# Patient Record
Sex: Female | Born: 1935 | Hispanic: No | Marital: Single | Smoking: Never smoker
Health system: Southern US, Community
[De-identification: ages and names within clinical notes are randomized; demographics above are authoritative.]

## PROBLEM LIST (undated history)

## (undated) DIAGNOSIS — Z8719 Personal history of other diseases of the digestive system: Secondary | ICD-10-CM

## (undated) DIAGNOSIS — K5730 Diverticulosis of large intestine without perforation or abscess without bleeding: Secondary | ICD-10-CM

## (undated) DIAGNOSIS — M8580 Other specified disorders of bone density and structure, unspecified site: Secondary | ICD-10-CM

## (undated) DIAGNOSIS — E079 Disorder of thyroid, unspecified: Secondary | ICD-10-CM

## (undated) DIAGNOSIS — R569 Unspecified convulsions: Secondary | ICD-10-CM

## (undated) DIAGNOSIS — K649 Unspecified hemorrhoids: Secondary | ICD-10-CM

## (undated) DIAGNOSIS — M1990 Unspecified osteoarthritis, unspecified site: Secondary | ICD-10-CM

## (undated) DIAGNOSIS — H3530 Unspecified macular degeneration: Secondary | ICD-10-CM

## (undated) DIAGNOSIS — K219 Gastro-esophageal reflux disease without esophagitis: Secondary | ICD-10-CM

## (undated) HISTORY — PX: FOOT SURGERY: SHX648

## (undated) HISTORY — PX: JOINT REPLACEMENT: SHX530

## (undated) HISTORY — DX: Diverticulosis of large intestine without perforation or abscess without bleeding: K57.30

## (undated) HISTORY — DX: Unspecified hemorrhoids: K64.9

## (undated) HISTORY — PX: FEMUR FRACTURE SURGERY: SHX633

## (undated) HISTORY — PX: LASER ABLATION: SHX1947

## (undated) HISTORY — DX: Unspecified convulsions: R56.9

## (undated) HISTORY — PX: COLONOSCOPY: SHX174

## (undated) HISTORY — DX: Gastro-esophageal reflux disease without esophagitis: K21.9

## (undated) HISTORY — DX: Disorder of thyroid, unspecified: E07.9

## (undated) HISTORY — PX: CARPAL TUNNEL RELEASE: SHX101

## (undated) HISTORY — PX: BREAST SURGERY: SHX581

## (undated) HISTORY — PX: EYE SURGERY: SHX253

---

## 1997-11-11 ENCOUNTER — Ambulatory Visit (HOSPITAL_COMMUNITY): Admission: RE | Admit: 1997-11-11 | Discharge: 1997-11-11 | Payer: Self-pay | Admitting: Gastroenterology

## 1998-03-06 ENCOUNTER — Other Ambulatory Visit: Admission: RE | Admit: 1998-03-06 | Discharge: 1998-03-06 | Payer: Self-pay | Admitting: Endocrinology

## 2000-03-11 ENCOUNTER — Other Ambulatory Visit: Admission: RE | Admit: 2000-03-11 | Discharge: 2000-03-11 | Payer: Self-pay | Admitting: Endocrinology

## 2000-12-05 ENCOUNTER — Inpatient Hospital Stay (HOSPITAL_COMMUNITY): Admission: EM | Admit: 2000-12-05 | Discharge: 2000-12-16 | Payer: Self-pay | Admitting: Emergency Medicine

## 2000-12-05 ENCOUNTER — Encounter: Payer: Self-pay | Admitting: Orthopaedic Surgery

## 2000-12-05 ENCOUNTER — Encounter: Payer: Self-pay | Admitting: Emergency Medicine

## 2000-12-09 ENCOUNTER — Encounter: Payer: Self-pay | Admitting: Orthopaedic Surgery

## 2001-08-25 ENCOUNTER — Ambulatory Visit (HOSPITAL_COMMUNITY): Admission: RE | Admit: 2001-08-25 | Discharge: 2001-08-25 | Payer: Self-pay | Admitting: Gastroenterology

## 2001-08-25 ENCOUNTER — Encounter (INDEPENDENT_AMBULATORY_CARE_PROVIDER_SITE_OTHER): Payer: Self-pay | Admitting: Specialist

## 2008-11-21 ENCOUNTER — Inpatient Hospital Stay (HOSPITAL_COMMUNITY): Admission: RE | Admit: 2008-11-21 | Discharge: 2008-11-25 | Payer: Self-pay | Admitting: Orthopedic Surgery

## 2010-06-29 LAB — BASIC METABOLIC PANEL
BUN: 3 mg/dL — ABNORMAL LOW (ref 6–23)
CO2: 26 mEq/L (ref 19–32)
CO2: 28 mEq/L (ref 19–32)
Calcium: 8.5 mg/dL (ref 8.4–10.5)
Chloride: 103 mEq/L (ref 96–112)
Chloride: 109 mEq/L (ref 96–112)
Creatinine, Ser: 0.53 mg/dL (ref 0.4–1.2)
Creatinine, Ser: 0.58 mg/dL (ref 0.4–1.2)
GFR calc Af Amer: >60 mL/min (ref >60)
GFR calc Af Amer: >60 mL/min (ref >60)
GFR calc non Af Amer: >60 mL/min (ref >60)
Glucose, Bld: 138 mg/dL — ABNORMAL HIGH (ref 70–99)
Potassium: 3.5 mEq/L (ref 3.5–5.1)
Sodium: 132 mEq/L — ABNORMAL LOW (ref 135–145)

## 2010-06-29 LAB — PROTIME-INR
INR: 1.3 (ref 0.00–1.49)
INR: 1.5 (ref 0.00–1.49)
Prothrombin Time: 16.1 seconds — ABNORMAL HIGH (ref 11.6–15.2)
Prothrombin Time: 18.4 seconds — ABNORMAL HIGH (ref 11.6–15.2)

## 2010-06-29 LAB — CBC
HCT: 30.5 % — ABNORMAL LOW (ref 36.0–46.0)
HCT: 30.8 % — ABNORMAL LOW (ref 36.0–46.0)
Hemoglobin: 10.4 g/dL — ABNORMAL LOW (ref 12.0–15.0)
Hemoglobin: 10.5 g/dL — ABNORMAL LOW (ref 12.0–15.0)
MCHC: 34 g/dL (ref 30.0–36.0)
MCHC: 34 g/dL (ref 30.0–36.0)
MCV: 96.6 fL (ref 78.0–100.0)
MCV: 97.1 fL (ref 78.0–100.0)
Platelets: 137 10*3/uL — ABNORMAL LOW (ref 150–400)
Platelets: 138 10*3/uL — ABNORMAL LOW (ref 150–400)
RBC: 3.15 MIL/uL — ABNORMAL LOW (ref 3.87–5.11)
RBC: 3.19 MIL/uL — ABNORMAL LOW (ref 3.87–5.11)
RDW: 13.2 % (ref 11.5–15.5)
RDW: 13.8 % (ref 11.5–15.5)
WBC: 5.5 10*3/uL (ref 4.0–10.5)
WBC: 7.2 10*3/uL (ref 4.0–10.5)

## 2010-06-30 LAB — COMPREHENSIVE METABOLIC PANEL
CO2: 27 mEq/L (ref 19–32)
Calcium: 9.7 mg/dL (ref 8.4–10.5)
Creatinine, Ser: 0.66 mg/dL (ref 0.4–1.2)
GFR calc Af Amer: >60 mL/min (ref >60)
GFR calc non Af Amer: >60 mL/min (ref >60)
Glucose, Bld: 89 mg/dL (ref 70–99)

## 2010-06-30 LAB — PROTIME-INR: INR: 1.1 (ref 0.00–1.49)

## 2010-06-30 LAB — CBC
Hemoglobin: 11.1 g/dL — ABNORMAL LOW (ref 12.0–15.0)
Hemoglobin: 13.5 g/dL (ref 12.0–15.0)
MCHC: 33.5 g/dL (ref 30.0–36.0)
MCHC: 34.1 g/dL (ref 30.0–36.0)
MCV: 96.7 fL (ref 78.0–100.0)
MCV: 97.1 fL (ref 78.0–100.0)
RBC: 3.36 MIL/uL — ABNORMAL LOW (ref 3.87–5.11)
RBC: 4.15 MIL/uL (ref 3.87–5.11)
WBC: 6.2 10*3/uL (ref 4.0–10.5)

## 2010-06-30 LAB — TYPE AND SCREEN: ABO/RH(D): B POS

## 2010-06-30 LAB — URINALYSIS, ROUTINE W REFLEX MICROSCOPIC
Nitrite: NEGATIVE
Specific Gravity, Urine: 1.016 (ref 1.005–1.030)
pH: 6 (ref 5.0–8.0)

## 2010-06-30 LAB — BASIC METABOLIC PANEL
CO2: 26 mEq/L (ref 19–32)
Calcium: 8.4 mg/dL (ref 8.4–10.5)
Chloride: 105 mEq/L (ref 96–112)
GFR calc Af Amer: >60 mL/min (ref >60)
Sodium: 134 mEq/L — ABNORMAL LOW (ref 135–145)

## 2010-06-30 LAB — URINE MICROSCOPIC-ADD ON

## 2010-07-20 ENCOUNTER — Other Ambulatory Visit: Payer: Self-pay | Admitting: Surgery

## 2010-08-07 NOTE — Op Note (Signed)
[name], Walker               ACCOUNT NO.:  [id number]   MEDICAL RECORD NO.:  [id number]          PATIENT TYPE:  INP   LOCATION:  NA                           FACILITY:  [facility]   PHYSICIAN:  Ollen Gross, M.D.    DATE OF BIRTH:  08/12/1930   DATE OF PROCEDURE:  11/21/2008  DATE OF DISCHARGE:                               OPERATIVE REPORT   PREOPERATIVE DIAGNOSIS:  Osteoarthritis, left knee.   POSTOPERATIVE DIAGNOSIS:  Osteoarthritis, left knee.   PROCEDURE:  Left total knee arthroplasty.   SURGEON:  Ollen Gross, M.D.   ASSISTANT:  Avel Peace, PA-C   ANESTHESIA:  Spinal.   ESTIMATED BLOOD LOSS:  Minimal.   DRAINS:  None.   TOURNIQUET TIME:  24 minutes at 300 mmHg.   COMPLICATIONS:  None.   CONDITION:  Stable to recovery.   BRIEF CLINICAL NOTE:  [name] is a 77 year old female with advanced  arthritis of the left knee with progressively worsening pain and  dysfunction.  She has failed nonoperative management and presents now  for left total knee arthroplasty.   PROCEDURE IN DETAIL:  After successful administration of spinal  anesthetic, a tourniquet is placed on her left thigh and her left lower  extremity is prepped and draped in the usual sterile fashion.  Extremities were wrapped in Esmarch, knee flexed, tourniquet inflated to  300 mmHg.  Midline incision was made with 10 blade through subcutaneous  tissue to the level of the extensor mechanism.  A fresh blade is used  make a medial parapatellar arthrotomy.  Soft tissue on the proximal  medial tibia is subperiosteally elevated to the joint line with the  knife and into the semimembranosus bursa with a Cobb elevator.  Soft  tissue laterally is elevated with attention being paid to avoid the  patellar tendon on the tibial tubercle.  Patella subluxed laterally,  knee flexed 90 degrees and ACL and PCL were removed.  Drill was used to  create a starting hole in the distal femur and the canal was thoroughly  irrigated.  The 5-degree left valgus alignment guide is placed and the  block is pinned to remove 10 mm of the distal femur.  Distal femoral  resection is made with an oscillating saw.  Sizing blocks placed, size  2.5 is most appropriate.  Rotation is marked off the epicondylar axis.  A size 2.5 cutting block is placed and then the anterior-posterior and  chamfer cuts are made.   The tibia subluxed forward and the menisci were removed.  Extramedullary  tibial alignment guide is placed referencing proximally at the medial  aspect of tibial tubercle and distally along the second metatarsal axis  and tibial crest.  The blocks were pinned to remove the 2 mm off the  more deficient medial side.  Tibial resection was made with an  oscillating saw.  A size 2.5 is the most appropriate tibial component  and the proximal tibia is prepared with a modular drill and keel punch  for the size 2.5.  Femoral preparation is completed with the  intercondylar cut.   Size 2.5  mobile bearing tibial trial 2.5 posterior stabilized femoral  trial and a 10-mm posterior stabilized rotating platform insert trial  were placed.  With the 10 there was some AP laxity so we went to 12.5  which allowed for full extension with excellent varus-valgus and  anterior-posterior balance throughout full range of motion.  The patella  was then everted and thickness measured to be 22 mm.  The patella was  cut to a thickness of 12 mm with the oscillating saw and a 35 template  is placed, lug holes were drilled, trial patella was placed and it  tracks normally.  Osteophytes were removed off the posterior femur with  the trial in place.  All trials were removed and the cut bone surfaces  are prepared with pulsatile lavage.  Cement is mixed and once ready for  implantation the size 2.5 mobile bearing tibial tray size 2.5 posterior  stabilized femur and 35 patella are cemented into place and patella is  held in place with a clamp.   Trial 12.5-mm insert was placed, knee held  in full extension and all extruded cement removed.  When the cement was  fully hardened, then a permanent 12.5 mm posterior stabilized rotating  platform insert is placed in the tibial tray.  It was copiously  irrigated with saline solution.  FloSeal was injected on the medial and  lateral gutters and suprapatellar area.  Moist sponge is placed and  tourniquet released for total time of 24 minutes.  Sponge is held for 2  minutes then removed.  Minimal bleeding was encountered.  The bleeding  that is encountered is stopped with electrocautery.  The wound is again  irrigated to remove the FloSeal and then the arthrotomy closed with  interrupted #1 PDS.  Flexion against gravity is about 140 degrees.  Subcu was closed with interrupted 2-0 Vicryl and subcuticular running 4-  0 Monocryl.  The incisions were cleaned and dried and Steri-Strips and a  bulky sterile dressing were applied.  She was then placed into a knee  immobilizer, awakened and transported to recovery in stable condition.      Ollen Gross, M.D.  Electronically Signed     FA/MEDQ  D:  11/21/2008  T:  11/21/2008  Job:  [id number]

## 2010-08-07 NOTE — Discharge Summary (Signed)
NAMEKYNDAHL, JABLON               ACCOUNT NO.:  [id number]   MEDICAL RECORD NO.:  [id number]          PATIENT TYPE:  INP   LOCATION:  1617                         FACILITY:  [facility]   PHYSICIAN:  Ollen Gross, M.D.    DATE OF BIRTH:  01-21-1931   DATE OF ADMISSION:  11/21/2008  DATE OF DISCHARGE:  11/24/2008                               DISCHARGE SUMMARY   ADMISSION DIAGNOSES:  1. Osteoarthritis of the left knee.  2. Impaired vision.  3. Cataracts.  4. History of phlebitis.  5. Varicose veins.  6. Hypothyroidism.  7. Osteopenia.   DISCHARGE DIAGNOSES:  1. Osteoarthritis of the left knee status post left total knee      replacement arthroplasty.  2. Postoperative hyponatremia.  3. Impaired vision.  4. Cataracts.  5. History of phlebitis.  6. Varicose veins.  7. Hypothyroidism.  8. Osteopenia.   PROCEDURE:  November 21, 2008, left total knee.   SURGEON:  Ollen Gross, M.D.   ASSISTANT:  Alexzandrew L. Perkins, P.A.C.   ANESTHESIA:  Spinal.   TOURNIQUET TIME:  Twenty-four minutes.   CONSULTS:  None.   BRIEF HISTORY:  Ms. Roma is a 77 year old female with end-stage arthritis  of the left knee with progressive worsening pain and dysfunction, who  failed operative management and now presents for left total knee  arthroplasty.   LABORATORY DATA:  Preop CBC showed hemoglobin 13.5, hematocrit 40.3,  white cell count 3.6, platelets 180, PT/INR 13.2 and 1.0, PTT of 28.  Chem panel on admission all within normal limits.  Preop UA did show  trace leukocytes, 0-2 white cells, rare squamous, otherwise negative.  Serial CBCs were followed.  Hemoglobin dropped down to 11.1 and then  10.4.  Last known H and H 10.5 and 30.8.  Serial BMETs were followed.  Sodium did drop from 142-134, last noted at 132.  Recommend rechecking  while at [facility] next week or end of week.   DIAGNOSTICS:  1. Chest x-ray October 06, 2008, two-view chest, no active disease.  2. EKG October 06, 2008,  sinus rhythm with first-degree AV block      confirmed by Dr. Evlyn Kanner.   HOSPITAL COURSE:  The patient admitted to [facility], taken to  the OR and underwent the above stated procedure without complication.  The patient tolerated the procedure well.  Later transferred to the  recovery room on the orthopedic floor and started on PCA and p.o.  analgesic pain control following surgery.  Given 24 hours postop IV  antibiotics.  Doing pretty well on the morning of day 1 with the  exception of nausea.  Discontinued the PCA.  She wanted look into a  skilled rehab facility, so we got social work involved.  Started back on  her home meds.  Hemoglobin looked good.  Sodium was down just a little  bit felt to be more to a dilutional component.  By day 2, the nausea  that was happening on day 1 had all but resolved.  She was feeling  better.  Sodium was down a little bit just from a delusional component,  but she was diuresing her fluids well.  Felt that it would concentrate  back up on its own.  Dressing was changed and incision looked good.  By  day 3, she was waiting on final bed offers and wanted a bed over at  [facility], anticipated a bed.  Was seen in rounds.  Hemoglobin was  stable.  No complaints.  Transferred at that time.   DISPOSITION:  Transferred to [facility] on November 24, 2008.   CURRENT TRANSFER MEDICATIONS:  1. She is on Coumadin, Coumadin protocol.  Please titrate the Coumadin      level for a target INR between 2.0 and 3.0.  She needs to be on      Coumadin for a total of 3 weeks.  2. Colace 100 mg p.o. b.i.d.  3. Levothyroxine 75 mcg p.o. daily.  4. Robaxin 500 mg p.o. q.6-8 h. p.r.n. spasm.  5. Tylenol 325 one-two every 4-6 hours as needed for mild pain.  6. Percocet 5 mg one-two every 4 hours as needed for moderate pain.  7. Senokot p.o. daily p.r.n. constipation.  8. Please note her Evista and vitamin E are currently on hold while      she is on Coumadin.    DIET:  As tolerated.   ACTIVITY:  She is weightbearing as tolerated total hip protocol.  She  may start showering, however, do not submerge the incision under water.  Gait training ambulation, ADLs, PT and OT for total knee protocol.   FOLLOW UP:  She needs to follow up with Dr. Lequita Halt in the office 2  weeks from date of surgery.  Please contact the office of [phone] to  help arrange follow up appointment.   CONDITION ON DISCHARGE:  Improving.      Alexzandrew L. Perkins, P.A.C.      Ollen Gross, M.D.  Electronically Signed    ALP/MEDQ  D:  11/24/2008  T:  11/24/2008  Job:  [id number]   cc:   Ollen Gross, M.D.  Fax: [phone]   Tera Mater. Evlyn Kanner, M.D.  Fax: [phone]

## 2010-08-07 NOTE — H&P (Signed)
[name], [name]               ACCOUNT NO.:  [id number]   MEDICAL RECORD NO.:  [id number]          PATIENT TYPE:  INP   LOCATION:  NA                           FACILITY:  [facility]   PHYSICIAN:  Ollen Gross, M.D.    DATE OF BIRTH:  [birth year]   DATE OF ADMISSION:  DATE OF DISCHARGE:                              HISTORY & PHYSICAL   CHIEF COMPLAINT:  Left knee pain.   HISTORY OF PRESENT ILLNESS:  The patient has been followed by Dr.  Lequita Walker for worsening left knee pain.  The patient states that her left  knee is hurting her constantly, both with activity and with rest, it is  preventing her from doing things.  The patient states she is also having  a hard time getting around.  The patient's x-ray reveals marked  arthritic change in the medial and patellofemoral compartments.  Dr.  Lequita Walker discussed injection as a short term solution, but the patient  states that she would like a more permanent option and feels that she is  ready for a left total knee arthroplasty.   ALLERGIES:  The patient is allergic to RED DYE.   CURRENT MEDICATIONS:  1. Vitamin D 50,000 units once a week.  2. Evista 60 mg 1 tablet daily.  3. Synthroid 0.75 mcg once daily.   Note, I discussed that patient needs to please stop the Vitamin D and  Evista and any multivitamin or aspirin 7 days prior to surgery.   CURRENT MEDICAL HISTORY:  1. Left knee arthritis.  2. Impaired vision.  3. Cataracts.  4. Phlebitis  5. Varicose veins.  6. Hypothyroidism.  7. Osteopenia.   PAST SURGICAL HISTORY:  1. Right knee replacement.  2. Cataract surgery.  3. Lens implants in her eyes bilaterally.   REVIEW OF SYSTEMS:  GENERAL:  No fevers, chills or night sweats.  HEENT/NEURO:  The patient denies any headaches, blurred vision.  Positive for balance problems.  RESPIRATORY:  Patient denies any  shortness of breath.  The patient admits that she does have seasonal  allergies.  The patient's last chest x-ray was October 06, 2008, and was  normal.  CARDIOVASCULAR:  Negative for chest pain, negative for  palpitations.  The patient's last electrocardiogram was also October 06, 2008, with no change from previous electrocardiogram.  GASTROINTESTINAL:  Negative for nausea, vomiting, diarrhea, negative for blood in the  stool.  Genitourinary:  Negative for frequent urinary tract infections.  Negative for pain with urination or blood in the urine.  MUSCULOSKELETAL:  Positive for joint pain, positive for morning  stiffness.   FAMILY MEDICAL HISTORY:  Father passed at 22 years of age of a heart  attack.  Mother passed at 55 years of age of natural causes.   SOCIAL HISTORY:  The patient is single.  She is a retired Actuary.  The patient states that she does not and has never smoked or  used tobacco products.  The patient states that she does have about 1-2  glasses of wine a week.  The patient lives alone.  She does  not have a  caregiver lined up after surgery.  The patient states that she would  like to be placed [facility] for rehabilitation after surgery.   PHYSICAL EXAMINATION:  Vital Signs:  Pulse 72, respirations 18, blood  pressure 125/72.  GENERAL:  A 77 year old white female well-developed, well-nourished, in  no acute distress.  The patient is alert and oriented x3 and she is a  good historian.  HEENT:  Unremarkable.  NECK:  Is supple, full range of motion without lymphadenopathy.  CHEST:  Lungs are clear to auscultation bilaterally without wheezes.  HEART:  Regular rate and rhythm without murmur.  ABDOMEN:  Soft, nontender, nondistended.  Bowel sounds are present in  all four quadrants.  EXTREMITIES:  Left knee is negative for effusion.  Range of motion is  about 5-120 degrees.  There is moderate crepitus palpated throughout the  range of motion.  Increased tenderness with palpation over the medial  joint line.  SKIN:  Unremarkable.  NEUROLOGIC:  Sensation is intact in lower extremities  bilaterally.  PERIPHERAL VASCULAR:  Carotid pulses 2+ bilaterally, negative for  carotid bruit.  Radial pulses 2+ bilaterally.   IMPRESSION:  Left knee osteoarthritis.   PLAN:  Left total knee arthroplasty to be performed by Dr. Lequita Walker  Monday, November 21, 2008, at [facility].  The patient will  undergo all preoperative labs at [facility] prior to surgery and  patient has been cleared for surgery by her primary care physician, Dr.  Adrian Walker.      [name], [facility]      Ollen Gross, M.D.  Electronically Signed    LD/MEDQ  D:  11/20/2008  T:  11/20/2008  Job:  [id number]

## 2010-08-10 NOTE — Op Note (Signed)
[facility]  Patient:    EMILYNN, SRINIVASAN Visit Number: [id number] MRN: [id number]          Service Type: Attending:  Patricia Nettle, M.D. Proc. Date: 12/09/00                             Operative Report  PREOPERATIVE DIAGNOSES: 1. Right periprosthetic tibia fracture. 2. Right bimalleolar ankle fracture.  PROCEDURE:  Remanipulation right tibia fracture and cast wedge.  SURGEON:  Sharolyn Douglas, M.D.  ANESTHESIA:  MAC.  COMPLICATIONS:  None.  DESCRIPTION OF PROCEDURE:  The patient was identified in the holding area as Devona Konig and taken to the operating room.  She was transferred to the operating room table and was given IV sedation.  The fluoroscopy machine was used to identify the level of the periprosthetic tibia fracture.  A cast saw was then used to cut circumferentially around the cast.  A cast spreader was used anteriorly, and a wedge was placed.  Fluoroscopic images were obtained and confirmed anatomic successful alignment of the fracture fragments. Fiberglass was then wrapped over the wedge.  Full length x-rays were obtained and once again showed acceptable alignment.  The small toe was cut back using the cast saw, and fiberglass was again used to hold down the cast padding. The patient was transported to the recovery room in stable condition and was taken to the holding area. Attending:  Patricia Nettle, M.D. DD:  12/09/00 TD:  12/09/00 Job: [id number]

## 2010-08-10 NOTE — Procedures (Signed)
[facility]  Patient:    [name], [name] Visit Number: [id number] MRN: [id number]          Service Type: END Location: ENDO Attending Physician:  Nelda Marseille Dictated by:   Petra Kuba, M.D. Proc. Date: 08/25/01 Admit Date:  08/25/2001   CC:         Jeannett Senior A. Evlyn Kanner, M.D.   Procedure Report  PROCEDURE:  Colonoscopy.  INDICATION:  Patient with history of colon polyps due for a repeat screening. Consent was signed after risks, benefits, methods, and options thoroughly discussed in the past.  MEDICATIONS:  Demerol 100, Versed 8.  DESCRIPTION OF PROCEDURE:  Rectal inspection was pertinent for external hemorrhoids, small.  Digital exam was negative.  The video pediatric adjustable colonoscope was inserted and easily advanced around the colon to the hepatic flexure.  This did require some abdominal pressure.  To advance to the cecum required rolling her on her back and abdominal pressure.  On insertion, a descending and transverse polyp were seen, thought they had appearance that we could find on withdrawal, so they were not biopsied; also some left-sided moderate diverticula were seen.  The cecum was identified by the appendiceal orifice and the ileocecal valve.  The prep was fairly adequate, did require lots of washing and suctioning for adequate visualization.  On slow withdrawal through the colon, four tiny ascending and transverse polyps were seen and were all hot biopsied.  The polyp seen on insertion was small in the transverse and was snared, electrocautery applied. The polyp was suctioned through the scope and collected in the trap after removal.  All of these polyps were put in the first container.  As the scope was withdrawn around the descending, the splenic flexure.  In the descending, the polyp seen on insertion was brought into view and was hot biopsied x 2 and put in a second container.  The scope was further withdrawn.  The  left-sided diverticula were confirmed.  In the sigmoid and rectum, two other tiny polyps were seen.  The one in the sigmoid was cold biopsied, the one in the rectum hot biopsied and put in a third container.  No other abnormalities were seen as we slowly withdrew back to the rectum.  The scope was retroflexed, pertinent for some internal hemorrhoids.  The scope was straightened and readvanced a short ways up the left side of the colon.  Air was suctioned and the scope removed.  The patient tolerated the procedure well.  There was no obvious immediate complication.  ENDOSCOPIC DIAGNOSES: 1. Internal and external small hemorrhoids. 2. Left-sided moderate diverticula. 3. 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 4. Rectal sigmoid tiny polyps, hot and cold biopsied, respectively. 5. Descending polyp, hot biopsied. 6. Five ascending and transverse tiny to small polyps with one snare and the    rest hot biopsied. 7. Otherwise within normal limits to the cecum.  PLAN: 1. Await pathology to determine future colonic screening. 2. Happy to see back p.r.n. 3. Otherwise, return care to Dr. Evlyn Kanner for the customary health care    maintenance to include yearly rectals and guaiacs. Dictated by:   Petra Kuba, M.D. Attending Physician:  Nelda Marseille DD:  08/25/01 TD:  08/26/01 Job: [id number]

## 2010-08-10 NOTE — Discharge Summary (Signed)
[facility]  Patient:    [name], [name] Visit Number: [id number] MRN: [id number]          Service Type: SUR Location: 4W 0445 02 Attending Physician:  Patricia Nettle Dictated by:   Della Goo, P.A. Admit Date:  12/05/2000 Disc. Date: 12/16/00                             Discharge Summary  ADMISSION DIAGNOSES: 1. Right periprosthetic tibia fracture. 2. Right ankle bimalleolar fracture. 3. Status post right total knee replacement with femoral fracture requiring    revision of knee replacement in 1990. 4. Glaucoma. 5. Cataracts. 6. Hypothyroidism.  DISCHARGE DIAGNOSES: 1. Right periprosthetic tibia fracture. 2. Right ankle bimalleolar fracture. 3. Status post right total knee replacement with femoral fracture requiring    revision of knee replacement in 1990. 4. Glaucoma. 5. Cataracts. 6. Hypothyroidism. 7. Mild anemia. 8. Urinary tract infection.  PROCEDURE: 1. On December 05, 2000, the patient underwent closed reduction right tibia    fracture with application of long leg cast.  This was performed by    Dr. Noel Gerold, assisted by Haze Rushing, P.A.-C., under general anesthesia. 2. On December 09, 2000, the patient underwent cast wedging of right lower    extremity under MAC anesthesia by Dr. Noel Gerold.  CONSULTATIONS:  None.  BRIEF HISTORY:  [name] is a 77 year old white female who fell on the day of admission at her home, twisting her ankle and falling to the ground.  She unfortunately lives alone and had to spend the night on her floor alone.  A neighbor was contacted the next day and she was brought to the emergency room via ambulance.  There, she was found to have a periprosthetic tibia fracture on the right, as well as a bimalleolar ankle fracture on the right.  It was felt she would require closed reduction and casting and was taken to the operating room for this procedure.  BRIEF HOSPITAL COURSE:  The patient tolerated the  casting without difficulty. She was initially placed on morphine via PCA pump; however, this caused nausea and she was weaned to oral analgesics without difficulty.  She was treated with incentive spirometry to avoid atelectasis.  She was placed on enteric-coated aspirin daily for DVT prophylaxis.  Postop x-rays on September 16 showed the periprosthetic fracture to be in valgus and ______ position.  It was felt she would require wedging of the cast.  She was taken back to the operating room on September 17 and underwent this procedure.  She tolerated this procedure without difficulty.  Following this, she was started on a regimen of physical therapy for ambulation and gait training and was nonweightbearing on the operative extremity.  She had some difficulty maneuvering with the long leg cast.  She eventually was able to gain some progress and ambulated as much as 35 feet with a walker.  She did do well with bed-to-chair transfers.  A bone stimulator was ordered and she received this during the hospital stay.  It was felt that she would not be able to be discharged to her home, as she lives alone and will need assistance. Unfortunately, a rehab bed was not available and she would need longer time than what was available at rehab.  Nursing home placement was initiated. During that time, the patient was afebrile and her vital signs were stable. The patient continued to have normal sensation to her toes and  good motor function to the toes.  The cast was in good repair throughout the hospital stay.  A bed offer was made on December 16, 2000 at [facility] and the patient accepted the bed.  She anticipates spending 20 days in the facility, followed by possibly another short stay in an assisted living facility prior to returning home.  The social workers will assist her in making these arrangements.  At the time of discharge, she was afebrile, vital signs were stable, and the  patient was comfortable with mild oral analgesics.  PERTINENT LABORATORY VALUES:  Admission CBC with hemoglobin 11.7, hematocrit 33.8.  On September 17, repeat hemoglobin and hematocrit showed values of 10.0 and 29.4, respectively.  Chemistry studies on admission were all within normal limits.  A repeat on September 17 showed values normal as well.  On September 16, cardiac markers including a CK was elevated at 216 and CK-MB elevated at 5.0.  Urinalysis on September 21 showed trace leukocyte esterase, 0-2 WBCs, 0-2 RBCs, and many bacteria.  A culture was obtained and showed greater than 100,000 colonies of E. coli.  This was sensitive to Cipro and she was started on Cipro at the time of discharge when she was transferred to the nursing facility.  She will utilize Cipro 500 mg 1 p.o. b.i.d. for a total of three days.  EKG on admission showed normal sinus rhythm, inferior infarct, age undetermined.  T-wave abnormality.  Consider lateral ischemia.  PLAN:  The patient is being transferred to [facility].  There, she will continue to have physical therapy daily for nonweightbearing ambulation on the right lower extremity.  She will do bed-to-chair transfers and ambulation with a walker.  Cast care will be done by the therapists and nursing staff as well.  She will follow up with Dr. Noel Gerold four weeks from the date of discharge and has been advised to call the office to arrange the appointment.  The nursing home will assist her with transportation to the office.  She will continue to use her bone stimulator to the tibia.  The [facility] representative has been contacted and advised for transfer.  They will be delivering to her the appropriate bone stimulator when it is available.  DISCHARGE MEDICATIONS: 1. Cipro 500 mg 1 p.o. b.i.d. for a total of three days starting on    December 16, 2000. 2. Evista 60 mg q.d. 3. Synthroid 0.075 mg q.d. 4. Robaxin 500 mg q.6-8h. as needed for  spasm. 5. Ecotrin 325 mg b.i.d. 6. Percocet 1-2 every 4-6 hours as needed for pain. 7. Benadryl 25 mg p.o. q.4-6h. p.r.n. itching.  8. Darvocet-N 100 one every 4-6 hours as needed for mild pain.  DISCHARGE INSTRUCTIONS:  She should continue with strict elevation of the right lower extremity when she is at bedrest.  She also has a collar to assist her with moving the cast, as it is quite heavy.  If there are questions or concerns regarding her care, Dr. Patria Mane office should be notified at [phone].  CONDITION ON DISCHARGE:  Stable. Dictated by:   Della Goo, P.A. Attending Physician:  Patricia Nettle. DD:  12/16/00 TD:  12/16/00 Job: [id number]

## 2010-08-10 NOTE — H&P (Signed)
[facility]  Patient:    [name] Visit Number: [id number] MRN: [id number]          Service Type: SUR Location: 1E 0105 01 Attending Physician:  Doug Sou Dictated by:   Marcie Bal Troncale, P.A.-C. Admit Date:  12/05/2000   CC:         Dr. Lillia Abed A. Evlyn Kanner, M.D.   History and Physical  CHIEF COMPLAINT:  Right ankle and right knee pain.  HISTORY OF PRESENT ILLNESS:  [name] is a 77 year old female who presents to [facility] with complaints of right knee and right ankle pain. The patient is well known to [facility] having undergone a right total knee arthroplasty back in February 1990.  She is currently followed by Dr. Darrelyn Hillock at [facility].  Of note, she also had a subsequent right femur fracture just proximal to her femoral component some years back which was treated operatively.  She has done well since with just some occasional episodes of problems with her knees, but nothing significant.  She has been an independent ambulator.  Last night, while going down the stairs to take her puppy out, she apparently lost her balance, twisted her ankle, and fell to the ground.  She had an inability to bear weight and the immediate onset of pain in the right knee and ankle.  She spent the night on the floor of her home.  She does live alone.  She was able to contact a neighbor today, who broke into her house and called EMS.  EMS transported the patient to [facility] where x-rays revealed a right knee periprosthetic tibia fracture as well as right ankle fracture.  Dr. Noel Gerold in orthopedics was consulted.  The patient denies any other injuries or complaints.  No numbness or tingling.  REVIEW OF SYSTEMS:  Significant for flare-up of seasonal allergies.  She denies any headaches, diplopia, or blurred vision.  No earache, rhinorrhea, or sore throat.  No cough, chest pain, or shortness of breath.  No  abdominal pain, nausea, vomiting, diarrhea, or constipation.  No melena or bright red blood per rectum.  No hematuria or dysuria.  PAST MEDICAL HISTORY:  Significant for glaucoma, cataracts, and hemorrhage. She denies a history of coronary artery disease, heart attack, stroke, seizures, cancer, diabetes, lung, or kidney disease.  PAST SURGICAL HISTORY:  As per the HPI.  Additionally, she reports history of bunion surgery as well as a colonoscopy which showed some benign polyps that were removed.  ALLERGIES:  Red dye.  CURRENT MEDICATIONS:  Evista and thyroxine.  SOCIAL HISTORY:  She lived alone.  She is retired.  She has no children.  She is a nonsmoker.  Dr. Adrian Prince is her medical physician.  PHYSICAL EXAMINATION:  VITAL SIGNS:  Temperature 97.6, pulse 72, respiratory rate 24, blood pressure 133/52.  GENERAL:  This is well-developed, well-nourished female in mild distress.  She does seem upset.  NECK:  Supple with cervical lymphadenopathy.  HEENT:  Oropharynx is clear without redness, exudate, or lesions.  Pupils are equal, round, and reactive to light and extraocular movements are grossly intact.  CHEST:  Clear to auscultation bilaterally with no wheezes or crackles.  HEART:  Regular rate and rhythm.  No murmurs, rubs, or gallops.  ABDOMEN:  Soft, nontender, nondistended with no masses.  She has positive bowel sounds.  BREASTS/GUR:  Not examined as not pertinent to present illness.  SKIN:  Intact.  There are no open wounds.  She does have swelling and ecchymosis moderately to the right proximal tibia.  Calf is soft, nontender. She also has some mild swelling and ecchymosis to the ankle.  NEUROLOGIC:  She is intact.  She is moving her feet and toes well.  Sensation is grossly intact in both lower extremities.  She has 2+ dorsalis pedis pulses bilaterally.  Slight tenderness to the left iliac crest.  Nontender in the groin.  No tenderness to logroll of the left  leg.  Right leg, she is tender at the medial and lateral malleoli.  She is also tender in the proximal fibula and proximal tibia.  Nontender femur.  LABORATORY DATA:  CBC and chemistry are currently pending.  Chest x-ray showed no acute disease.  Pelvis was negative.  She does have a right ankle non-displaced bimalleolar fracture.  Also, a right proximal fibular fracture, non-displaced.  Additionally, she has a right tibia, periprosthetic fracture Just at the base of the implant.  We do not have a good AP currently, rather a lateral and an oblique, but it does appear to be displaced.  IMPRESSION: 1. Right tibia periprosthetic fracture. 2. Right ankle bimalleolar fracture.  PLAN:  I discussed via phone with Dr. Noel Gerold.  He recommended that we admit the patient presently.  He will be coming by to see her shortly to determine further course and plan after review of the x-rays.  He requested that we get her into posterior splint, but the patient was unable to straighten her leg and despite the morphine given, her pain was such that I could not adequately raise her leg within patient comfort range to place a splint.  I was also unable to administer conscious sedation at this time in the emergency department without physician supervision, so she is currently comfortable in her present position.  We will keep her in this until Dr. Noel Gerold sees her shortly.  I have answered all questions for her at this time. Dictated by:   Marcie Bal Troncale, P.A.-C. Attending Physician:  Doug Sou DD:  12/05/00 TD:  12/05/00 Job: [id number]

## 2010-08-10 NOTE — Op Note (Signed)
[facility]  Patient:    KEILYN, HAGGARD Visit Number: [id number] MRN: [id number]          Service Type: SUR Location: 4W 0445 02 Attending Physician:  Patricia Nettle Dictated by:   Patricia Nettle, M.D. Proc. Date: 12/06/00 Admit Date:  12/05/2000                             Operative Report  PREOPERATIVE DIAGNOSES:  1. Right proximal tibia periprosthetic fracture.  2. Right bimalleolar ankle fracture.  POSTOPERATIVE DIAGNOSES:  1. Right proximal tibia periprosthetic fracture.  2. Right bimalleolar ankle fracture.  PROCEDURE:  1. Closed reduction and casting right proximal tibia fracture long leg     type.  2. Closed reduction and casting right ankle fracture.  SURGEON:  Patricia Nettle, M.D.  ASSISTANT:  Haze Rushing, P.A.-C.  COMPLICATIONS:  None.  DESCRIPTION OF PROCEDURE:  The patient was properly identified in the holding area as Devona Konig and brought to the operating room. She was transferred to the operating room table and underwent general endotracheal anesthesia without difficulty. Using fluoroscopy, the right proximal tibia fracture was reduced and a well padded long length fiberglass cast was placed. The bimalleolar ankle fracture was also reduced using the fluoroscopy unit and cast extended. The patient was extubated without difficulty and transferred to the recovery room in stable condition. Dictated by:   Patricia Nettle, M.D. Attending Physician:  Patricia Nettle. DD:  12/06/00 TD:  12/06/00 Job: [id number]

## 2011-11-08 ENCOUNTER — Other Ambulatory Visit (HOSPITAL_COMMUNITY): Payer: Self-pay | Admitting: Gastroenterology

## 2011-11-08 ENCOUNTER — Other Ambulatory Visit: Payer: Self-pay | Admitting: Gastroenterology

## 2011-11-08 DIAGNOSIS — R109 Unspecified abdominal pain: Secondary | ICD-10-CM

## 2011-11-12 ENCOUNTER — Ambulatory Visit (HOSPITAL_COMMUNITY)
Admission: RE | Admit: 2011-11-12 | Discharge: 2011-11-12 | Disposition: A | Discharge status: Home / Self Care | Payer: Medicare Other | Source: Ambulatory Visit | Attending: Gastroenterology | Admitting: Gastroenterology

## 2011-11-12 DIAGNOSIS — I2510 Atherosclerotic heart disease of native coronary artery without angina pectoris: Secondary | ICD-10-CM | POA: Insufficient documentation

## 2011-11-12 DIAGNOSIS — K8020 Calculus of gallbladder without cholecystitis without obstruction: Secondary | ICD-10-CM | POA: Insufficient documentation

## 2011-11-12 DIAGNOSIS — I700 Atherosclerosis of aorta: Secondary | ICD-10-CM | POA: Insufficient documentation

## 2011-11-12 DIAGNOSIS — R142 Eructation: Secondary | ICD-10-CM | POA: Insufficient documentation

## 2011-11-12 DIAGNOSIS — R141 Gas pain: Secondary | ICD-10-CM | POA: Insufficient documentation

## 2011-11-12 DIAGNOSIS — K449 Diaphragmatic hernia without obstruction or gangrene: Secondary | ICD-10-CM | POA: Insufficient documentation

## 2011-11-12 DIAGNOSIS — N1330 Unspecified hydronephrosis: Secondary | ICD-10-CM | POA: Insufficient documentation

## 2011-11-12 DIAGNOSIS — K7689 Other specified diseases of liver: Secondary | ICD-10-CM | POA: Insufficient documentation

## 2011-11-12 DIAGNOSIS — R109 Unspecified abdominal pain: Secondary | ICD-10-CM

## 2011-11-12 DIAGNOSIS — K5730 Diverticulosis of large intestine without perforation or abscess without bleeding: Secondary | ICD-10-CM | POA: Insufficient documentation

## 2012-04-14 ENCOUNTER — Encounter (INDEPENDENT_AMBULATORY_CARE_PROVIDER_SITE_OTHER): Payer: Self-pay

## 2012-04-15 ENCOUNTER — Encounter (INDEPENDENT_AMBULATORY_CARE_PROVIDER_SITE_OTHER): Payer: Self-pay | Admitting: Surgery

## 2012-04-15 ENCOUNTER — Ambulatory Visit (INDEPENDENT_AMBULATORY_CARE_PROVIDER_SITE_OTHER): Payer: Medicare Other | Admitting: Surgery

## 2012-04-15 VITALS — BP 138/74 | HR 76 | Temp 99.0°F | Resp 18 | Ht 61.0 in | Wt 150.0 lb

## 2012-04-15 DIAGNOSIS — K8010 Calculus of gallbladder with chronic cholecystitis without obstruction: Secondary | ICD-10-CM | POA: Insufficient documentation

## 2012-04-15 NOTE — Patient Instructions (Signed)
  [facility]  LAPAROSCOPIC SURGERY - POST-OP INSTRUCTIONS  Always review your discharge instruction sheet given to you by the facility where your surgery was performed.  A prescription for pain medication may be given to you upon discharge.  Take your pain medication as prescribed.  If narcotic pain medicine is not needed, then you may take acetaminophen (Tylenol) or ibuprofen (Advil) as needed.  Take your usually prescribed medications unless otherwise directed.  If you need a refill on your pain medication, please contact your pharmacy.  They will contact our office to request authorization. Prescriptions will not be filled after 5 P.M. or on weekends.  You should follow a light diet the first few days after arrival home, such as soup and crackers or toast.  Be sure to include plenty of fluids daily.  Most patients will experience some swelling and bruising in the area of the incisions.  Ice packs will help.  Swelling and bruising can take several days to resolve.   It is common to experience some constipation if taking pain medication after surgery.  Increasing fluid intake and taking a stool softener (such as Colace) will usually help or prevent this problem from occurring.  A mild laxative (Milk of Magnesia or Miralax) should be taken according to package instructions if there are no bowel movements after 48 hours.  Unless discharge instructions indicate otherwise, you may remove your bandages 24-48 hours after surgery, and you may shower at that time.  You may have steri-strips (small skin tapes) in place directly over the incision.  These strips should be left on the skin for 7-10 days.  If your surgeon used skin glue on the incision, you may shower in 24 hours.  The glue will flake off over the next 2-3 weeks.  Any sutures or staples will be removed at the office during your follow-up visit.  ACTIVITIES:  You may resume regular (light) daily activities beginning the  next day-such as daily self-care, walking, climbing stairs-gradually increasing activities as tolerated.  You may have sexual intercourse when it is comfortable.  Refrain from any heavy lifting or straining until approved by your doctor.  You may drive when you are no longer taking prescription pain medication, you can comfortably wear a seatbelt, and you can safely maneuver your car and apply brakes.  You should see your doctor in the office for a follow-up appointment approximately 2-3 weeks after your surgery.  Make sure that you call for this appointment within a day or two after you arrive home to insure a convenient appointment time.  WHEN TO CALL YOUR DOCTOR: 1. Fever over 101.0 2. Inability to urinate 3. Continued bleeding from incision 4. Increased pain, redness, or drainage from the incision 5. Increasing abdominal pain  The clinic staff is available to answer your questions during regular business hours.  Please don't hesitate to call and ask to speak to one of the nurses for clinical concerns.  If you have a medical emergency, go to the nearest emergency room or call 911.  A surgeon from [facility] is always on call for the hospital.  Malli Falotico M. Keishon Chavarin, MD, FACS [facility] Office: [phone] Toll Free:  [phone] FAX [phone]  Web site: [email or web address] 

## 2012-04-15 NOTE — Progress Notes (Signed)
General Surgery [facility]  Chief Complaint  Patient presents with  . New Evaluation    eval for gallbladder surgery - referral from Dr. Vida Rigger, [facility]    HISTORY: Patient is a 79 year old white female referred by her gastroenterologist for consideration for laparoscopic cholecystectomy. Patient was found by CT scan to have cholelithiasis. She has had several months of symptoms including nausea, occasional emesis, fatty food intolerance, and complains of a lot of gas and abdominal discomfort. Patient has taken Prilosec without symptomatic improvement. She does use ginger which appears to give her some relief from nausea.  Patient denies previous abdominal surgery. She denies any history of John this. She denies acholic stools. Patient has undergone colonoscopy with notable diverticular disease.  CT scan of abdomen and pelvis from August 2013 showed multiple gallstones. There was no biliary dilatation.  Past Medical History  Diagnosis Date  . Seizures   . Hemorrhoid   . Diverticulosis of colon   . GERD (gastroesophageal reflux disease)   . Thyroid disease      Current Outpatient Prescriptions  Medication Sig Dispense Refill  . levothyroxine (SYNTHROID, LEVOTHROID) 75 MCG tablet Take 75 mcg by mouth daily.      Marland Kitchen omeprazole (PRILOSEC) 20 MG capsule Take 20 mg by mouth daily.      . Probiotic Product (ALIGN) 4 MG CAPS Take by mouth.      . raloxifene (EVISTA) 60 MG tablet Take 60 mg by mouth daily.      . Vitamin D, Ergocalciferol, (DRISDOL) 50000 UNITS CAPS Take 50,000 Units by mouth.         Allergies  Allergen Reactions  . Ivp Dye (Iodinated Diagnostic Agents) Shortness Of Breath  . Red Dye      Family History  Problem Relation Age of Onset  . Cancer Mother      History   Social History  . Marital Status: Unknown    Spouse Name: N/A    Number of Children: N/A  . Years of Education: N/A   Social History Main Topics  .  Smoking status: Never Smoker   . Smokeless tobacco: None  . Alcohol Use: Yes  . Drug Use: No  . Sexually Active:    Other Topics Concern  . None   Social History Narrative  . None     REVIEW OF SYSTEMS - PERTINENT POSITIVES ONLY: Intermittent epigastric and right upper quadrant abdominal discomfort, gaseous distention, nausea, emesis. No signs of intestinal obstruction. Denies jaundice. Denies acholic stools.  EXAM: Filed Vitals:   04/15/12 1128  BP: 138/74  Pulse: 76  Temp: 99 F (37.2 C)  Resp: 18    HEENT: normocephalic; pupils equal and reactive; sclerae clear; dentition fair; mucous membranes moist NECK:  symmetric on extension; no palpable anterior or posterior cervical lymphadenopathy; no supraclavicular masses; no tenderness CHEST: clear to auscultation bilaterally without rales, rhonchi, or wheezes CARDIAC: regular rate and rhythm without significant murmur; peripheral pulses are full ABDOMEN: soft without distension; bowel sounds present; no mass; no hepatosplenomegaly; no hernia; mild tenderness to deep palpation in the epigastrium and right upper quadrant EXT:  non-tender without edema NEURO: no gross focal deficits; no sign of tremor   LABORATORY RESULTS: See [facility] (CHL-Epic) for most recent results   RADIOLOGY RESULTS: See [facility] (CHL-Epic) for most recent results   IMPRESSION: Symptomatic cholelithiasis  PLAN: I discussed the above findings at length with the patient. We reviewed her CT scan. I have recommended  laparoscopic cholecystectomy with intraoperative cholangiography. We have discussed the procedure at length and I provided her with written literature to review. We have discussed the hospital stay to be anticipated and the postoperative recovery. We discussed restrictions on her activities following the surgery. She understands and wishes to proceed. We will make arrangements for surgery in the near future.  The risks and  benefits of the procedure have been discussed at length with the patient.  The patient understands the proposed procedure, potential alternative treatments, and the course of recovery to be expected.  All of the patient's questions have been answered at this time.  The patient wishes to proceed with surgery.  Velora Heckler, MD, FACS General & Endocrine Surgery [facility]   Visit Diagnoses: 1. Cholelithiasis with cholecystitis     Primary Care Physician: No primary provider on file.

## 2012-04-20 ENCOUNTER — Encounter (HOSPITAL_COMMUNITY): Payer: Self-pay | Admitting: Pharmacy Technician

## 2012-04-21 ENCOUNTER — Encounter (HOSPITAL_COMMUNITY): Payer: Self-pay

## 2012-04-21 ENCOUNTER — Encounter (HOSPITAL_COMMUNITY)
Admission: RE | Admit: 2012-04-21 | Discharge: 2012-04-21 | Disposition: A | Discharge status: Home / Self Care | Payer: Medicare Other | Source: Ambulatory Visit | Attending: Surgery | Admitting: Surgery

## 2012-04-21 HISTORY — DX: Personal history of other diseases of the digestive system: Z87.19

## 2012-04-21 HISTORY — DX: Unspecified osteoarthritis, unspecified site: M19.90

## 2012-04-21 HISTORY — DX: Unspecified macular degeneration: H35.30

## 2012-04-21 HISTORY — DX: Other specified disorders of bone density and structure, unspecified site: M85.80

## 2012-04-21 LAB — CBC
Hemoglobin: 14 g/dL (ref 12.0–15.0)
MCH: 31.3 pg (ref 26.0–34.0)
MCV: 94 fL (ref 78.0–100.0)
RBC: 4.48 MIL/uL (ref 3.87–5.11)

## 2012-04-21 LAB — SURGICAL PCR SCREEN
MRSA, PCR: NEGATIVE
Staphylococcus aureus: NEGATIVE

## 2012-04-21 NOTE — Patient Instructions (Addendum)
[name]  04/21/2012                           YOUR PROCEDURE IS SCHEDULED ON:  04/30/12               PLEASE REPORT TO SHORT STAY CENTER AT :  7:15 AM               CALL THIS NUMBER IF ANY PROBLEMS THE DAY OF SURGERY :               [phone]                      REMEMBER:   Do not eat food or drink liquids AFTER MIDNIGHT   Take these medicines the morning of surgery with A SIP OF WATER: LEVOTHYROXINE / PRILOSEC / EVISTA   Do not wear jewelry, make-up   Do not wear lotions, powders, or perfumes.   Do not shave legs or underarms 12 hrs. before surgery (men may shave face)  Do not bring valuables to the hospital.  Contacts, dentures or bridgework may not be worn into surgery.  Leave suitcase in the car. After surgery it may be brought to your room.  For patients admitted to the hospital more than one night, checkout time is 11:00                          The day of discharge.   Patients discharged the day of surgery will not be allowed to drive home                             If going home same day of surgery, must have someone stay with you first                           24 hrs at home and arrange for some one to drive you home from hospital.    Special Instructions:   Please read over the following fact sheets that you were given:               1. MRSA  INFORMATION                      2. [place] PREPARING FOR SURGERY SHEET               3. DISCONTINUE ASPIRIN AND HERBAL MEDICATIONS                                                 X_____________________________________________________________________        Failure to follow these instructions may result in cancellation of your surgery

## 2012-04-30 ENCOUNTER — Encounter (HOSPITAL_COMMUNITY): Payer: Self-pay | Admitting: Certified Registered Nurse Anesthetist

## 2012-04-30 ENCOUNTER — Encounter (HOSPITAL_COMMUNITY)
Admission: RE | Disposition: A | Discharge status: Home / Self Care | Payer: Self-pay | Source: Ambulatory Visit | Attending: Surgery

## 2012-04-30 ENCOUNTER — Encounter (HOSPITAL_COMMUNITY): Payer: Self-pay | Admitting: Surgery

## 2012-04-30 ENCOUNTER — Observation Stay (HOSPITAL_COMMUNITY)
Admission: RE | Admit: 2012-04-30 | Discharge: 2012-05-01 | Disposition: A | Discharge status: Home / Self Care | Payer: Medicare Other | Source: Ambulatory Visit | Attending: Surgery | Admitting: Surgery

## 2012-04-30 ENCOUNTER — Encounter (HOSPITAL_COMMUNITY): Payer: Self-pay

## 2012-04-30 ENCOUNTER — Ambulatory Visit (HOSPITAL_COMMUNITY): Payer: Medicare Other | Admitting: Certified Registered Nurse Anesthetist

## 2012-04-30 ENCOUNTER — Encounter (HOSPITAL_COMMUNITY): Payer: Self-pay | Admitting: *Deleted

## 2012-04-30 ENCOUNTER — Ambulatory Visit (HOSPITAL_COMMUNITY): Payer: Medicare Other

## 2012-04-30 DIAGNOSIS — K8010 Calculus of gallbladder with chronic cholecystitis without obstruction: Secondary | ICD-10-CM

## 2012-04-30 DIAGNOSIS — K219 Gastro-esophageal reflux disease without esophagitis: Secondary | ICD-10-CM | POA: Insufficient documentation

## 2012-04-30 DIAGNOSIS — Z79899 Other long term (current) drug therapy: Secondary | ICD-10-CM | POA: Insufficient documentation

## 2012-04-30 DIAGNOSIS — E079 Disorder of thyroid, unspecified: Secondary | ICD-10-CM | POA: Insufficient documentation

## 2012-04-30 DIAGNOSIS — K449 Diaphragmatic hernia without obstruction or gangrene: Secondary | ICD-10-CM | POA: Insufficient documentation

## 2012-04-30 HISTORY — PX: CHOLECYSTECTOMY: SHX55

## 2012-04-30 SURGERY — LAPAROSCOPIC CHOLECYSTECTOMY WITH INTRAOPERATIVE CHOLANGIOGRAM
Anesthesia: General | Site: Abdomen | Wound class: Clean Contaminated

## 2012-04-30 MED ORDER — DEXAMETHASONE SODIUM PHOSPHATE 10 MG/ML IJ SOLN
INTRAMUSCULAR | Status: DC | PRN
  Administered 2012-04-30: 10 mg via INTRAVENOUS

## 2012-04-30 MED ORDER — ACETAMINOPHEN 10 MG/ML IV SOLN
INTRAVENOUS | Status: AC
  Filled 2012-04-30: qty 100

## 2012-04-30 MED ORDER — PANTOPRAZOLE SODIUM 40 MG PO TBEC
40.0000 mg | DELAYED_RELEASE_TABLET | Freq: Every day | ORAL | Status: DC
  Administered 2012-05-01: 40 mg via ORAL
  Filled 2012-04-30 (×2): qty 1

## 2012-04-30 MED ORDER — BIOTENE DRY MOUTH MT LIQD
15.0000 mL | Freq: Two times a day (BID) | OROMUCOSAL | Status: DC
  Administered 2012-04-30 – 2012-05-01 (×2): 15 mL via OROMUCOSAL

## 2012-04-30 MED ORDER — NEOSTIGMINE METHYLSULFATE 1 MG/ML IJ SOLN
INTRAMUSCULAR | Status: DC | PRN
  Administered 2012-04-30: 5 mg via INTRAVENOUS

## 2012-04-30 MED ORDER — LEVOTHYROXINE SODIUM 75 MCG PO TABS
75.0000 ug | ORAL_TABLET | Freq: Every day | ORAL | Status: DC
  Administered 2012-05-01: 75 ug via ORAL
  Filled 2012-04-30 (×2): qty 1

## 2012-04-30 MED ORDER — ROCURONIUM BROMIDE 100 MG/10ML IV SOLN
INTRAVENOUS | Status: DC | PRN
  Administered 2012-04-30: 30 mg via INTRAVENOUS

## 2012-04-30 MED ORDER — PROPOFOL 10 MG/ML IV BOLUS
INTRAVENOUS | Status: DC | PRN
  Administered 2012-04-30: 120 mg via INTRAVENOUS

## 2012-04-30 MED ORDER — HYDROMORPHONE HCL PF 1 MG/ML IJ SOLN
1.0000 mg | INTRAMUSCULAR | Status: DC | PRN
  Administered 2012-04-30: 1 mg via INTRAVENOUS
  Filled 2012-04-30: qty 1

## 2012-04-30 MED ORDER — ONDANSETRON HCL 4 MG PO TABS: 4.0000 mg | ORAL_TABLET | Freq: Four times a day (QID) | ORAL | Status: DC | PRN

## 2012-04-30 MED ORDER — FENTANYL CITRATE 0.05 MG/ML IJ SOLN
INTRAMUSCULAR | Status: DC | PRN
  Administered 2012-04-30 (×3): 25 ug via INTRAVENOUS
  Administered 2012-04-30: 50 ug via INTRAVENOUS
  Administered 2012-04-30 (×4): 25 ug via INTRAVENOUS

## 2012-04-30 MED ORDER — METOCLOPRAMIDE HCL 5 MG/ML IJ SOLN: 10.0000 mg | Freq: Once | INTRAMUSCULAR | Status: DC | PRN

## 2012-04-30 MED ORDER — CEFAZOLIN SODIUM-DEXTROSE 2-3 GM-% IV SOLR
2.0000 g | INTRAVENOUS | Status: AC
  Administered 2012-04-30: 2 g via INTRAVENOUS

## 2012-04-30 MED ORDER — GLYCOPYRROLATE 0.2 MG/ML IJ SOLN
INTRAMUSCULAR | Status: DC | PRN
  Administered 2012-04-30: 0.6 mg via INTRAVENOUS

## 2012-04-30 MED ORDER — HYDROCODONE-ACETAMINOPHEN 5-325 MG PO TABS
1.0000 | ORAL_TABLET | ORAL | Status: DC | PRN
  Administered 2012-05-01 (×2): 1 via ORAL
  Filled 2012-04-30 (×2): qty 1

## 2012-04-30 MED ORDER — ACETAMINOPHEN 325 MG PO TABS: 650.0000 mg | ORAL_TABLET | ORAL | Status: DC | PRN

## 2012-04-30 MED ORDER — RALOXIFENE HCL 60 MG PO TABS
60.0000 mg | ORAL_TABLET | Freq: Every day | ORAL | Status: DC
  Administered 2012-05-01: 60 mg via ORAL
  Filled 2012-04-30: qty 1

## 2012-04-30 MED ORDER — 0.9 % SODIUM CHLORIDE (POUR BTL) OPTIME
TOPICAL | Status: DC | PRN
  Administered 2012-04-30: 1000 mL

## 2012-04-30 MED ORDER — LACTATED RINGERS IV SOLN
INTRAVENOUS | Status: DC
  Administered 2012-04-30: 1000 mL via INTRAVENOUS

## 2012-04-30 MED ORDER — BUPIVACAINE-EPINEPHRINE PF 0.25-1:200000 % IJ SOLN
INTRAMUSCULAR | Status: AC
  Filled 2012-04-30: qty 30

## 2012-04-30 MED ORDER — KCL IN DEXTROSE-NACL 20-5-0.45 MEQ/L-%-% IV SOLN
INTRAVENOUS | Status: DC
  Administered 2012-04-30: 15:00:00 via INTRAVENOUS
  Filled 2012-04-30 (×2): qty 1000

## 2012-04-30 MED ORDER — IOHEXOL 300 MG/ML  SOLN
INTRAMUSCULAR | Status: AC
  Filled 2012-04-30: qty 1

## 2012-04-30 MED ORDER — HYDROMORPHONE HCL PF 1 MG/ML IJ SOLN: 0.2500 mg | INTRAMUSCULAR | Status: DC | PRN

## 2012-04-30 MED ORDER — LIDOCAINE HCL (CARDIAC) 20 MG/ML IV SOLN
INTRAVENOUS | Status: DC | PRN
  Administered 2012-04-30: 100 mg via INTRAVENOUS

## 2012-04-30 MED ORDER — ACETAMINOPHEN 10 MG/ML IV SOLN
INTRAVENOUS | Status: DC | PRN
  Administered 2012-04-30: 1000 mg via INTRAVENOUS

## 2012-04-30 MED ORDER — CEFAZOLIN SODIUM-DEXTROSE 2-3 GM-% IV SOLR
INTRAVENOUS | Status: AC
  Filled 2012-04-30: qty 50

## 2012-04-30 MED ORDER — SUCCINYLCHOLINE CHLORIDE 20 MG/ML IJ SOLN
INTRAMUSCULAR | Status: DC | PRN
  Administered 2012-04-30: 100 mg via INTRAVENOUS

## 2012-04-30 MED ORDER — ATROPINE SULFATE 0.4 MG/ML IJ SOLN
INTRAMUSCULAR | Status: DC | PRN
  Administered 2012-04-30: 0.2 mg via INTRAVENOUS

## 2012-04-30 MED ORDER — ONDANSETRON HCL 4 MG/2ML IJ SOLN: 4.0000 mg | Freq: Four times a day (QID) | INTRAMUSCULAR | Status: DC | PRN

## 2012-04-30 MED ORDER — OXYCODONE HCL 5 MG PO TABS: 5.0000 mg | ORAL_TABLET | Freq: Once | ORAL | Status: DC | PRN

## 2012-04-30 MED ORDER — IOHEXOL 300 MG/ML  SOLN
INTRAMUSCULAR | Status: DC | PRN
  Administered 2012-04-30: 50 mL via INTRAVENOUS

## 2012-04-30 MED ORDER — ONDANSETRON HCL 4 MG/2ML IJ SOLN
INTRAMUSCULAR | Status: DC | PRN
  Administered 2012-04-30: 4 mg via INTRAVENOUS

## 2012-04-30 MED ORDER — BUPIVACAINE-EPINEPHRINE 0.5% -1:200000 IJ SOLN
INTRAMUSCULAR | Status: DC | PRN
  Administered 2012-04-30: 20 mL

## 2012-04-30 MED ORDER — LACTATED RINGERS IV SOLN
INTRAVENOUS | Status: DC | PRN
  Administered 2012-04-30: 1000 mL via INTRAVENOUS
  Administered 2012-04-30: 2000 mL via INTRAVENOUS

## 2012-04-30 SURGICAL SUPPLY — 38 items
APPLIER CLIP ROT 10 11.4 M/L (STAPLE) ×2
BENZOIN TINCTURE PRP APPL 2/3 (GAUZE/BANDAGES/DRESSINGS) ×2 IMPLANT
CABLE HIGH FREQUENCY MONO STRZ (ELECTRODE) ×2 IMPLANT
CANISTER SUCTION 2500CC (MISCELLANEOUS) ×2 IMPLANT
CHLORAPREP W/TINT 26ML (MISCELLANEOUS) ×2 IMPLANT
CLIP APPLIE ROT 10 11.4 M/L (STAPLE) ×1 IMPLANT
CLOTH BEACON ORANGE TIMEOUT ST (SAFETY) ×2 IMPLANT
CLSR STERI-STRIP ANTIMIC 1/2X4 (GAUZE/BANDAGES/DRESSINGS) ×2 IMPLANT
COVER MAYO STAND STRL (DRAPES) ×2 IMPLANT
DECANTER SPIKE VIAL GLASS SM (MISCELLANEOUS) ×2 IMPLANT
DRAPE C-ARM 42X72 X-RAY (DRAPES) ×2 IMPLANT
DRAPE LAPAROSCOPIC ABDOMINAL (DRAPES) ×2 IMPLANT
DRAPE UTILITY 15X26 (DRAPE) ×2 IMPLANT
ELECT REM PT RETURN 9FT ADLT (ELECTROSURGICAL) ×2
ELECTRODE REM PT RTRN 9FT ADLT (ELECTROSURGICAL) ×1 IMPLANT
GLOVE BIOGEL PI IND STRL 7.0 (GLOVE) ×1 IMPLANT
GLOVE BIOGEL PI INDICATOR 7.0 (GLOVE) ×1
GLOVE SURG ORTHO 8.0 STRL STRW (GLOVE) ×2 IMPLANT
GOWN STRL NON-REIN LRG LVL3 (GOWN DISPOSABLE) ×2 IMPLANT
GOWN STRL REIN XL XLG (GOWN DISPOSABLE) ×4 IMPLANT
HEMOSTAT SNOW SURGICEL 2X4 (HEMOSTASIS) ×2 IMPLANT
HEMOSTAT SURGICEL 4X8 (HEMOSTASIS) IMPLANT
KIT BASIN OR (CUSTOM PROCEDURE TRAY) ×2 IMPLANT
NS IRRIG 1000ML POUR BTL (IV SOLUTION) ×2 IMPLANT
POUCH SPECIMEN RETRIEVAL 10MM (ENDOMECHANICALS) ×2 IMPLANT
SCISSORS LAP 5X35 DISP (ENDOMECHANICALS) ×2 IMPLANT
SET CHOLANGIOGRAPH MIX (MISCELLANEOUS) ×2 IMPLANT
SET IRRIG TUBING LAPAROSCOPIC (IRRIGATION / IRRIGATOR) ×2 IMPLANT
SLEEVE Z-THREAD 5X100MM (TROCAR) ×2 IMPLANT
SOLUTION ANTI FOG 6CC (MISCELLANEOUS) ×2 IMPLANT
STRIP CLOSURE SKIN 1/2X4 (GAUZE/BANDAGES/DRESSINGS) ×2 IMPLANT
SUT MNCRL AB 4-0 PS2 18 (SUTURE) ×2 IMPLANT
TOWEL OR 17X26 10 PK STRL BLUE (TOWEL DISPOSABLE) ×6 IMPLANT
TRAY LAP CHOLE (CUSTOM PROCEDURE TRAY) ×2 IMPLANT
TROCAR XCEL BLUNT TIP 100MML (ENDOMECHANICALS) ×2 IMPLANT
TROCAR Z-THREAD FIOS 11X100 BL (TROCAR) ×2 IMPLANT
TROCAR Z-THREAD FIOS 5X100MM (TROCAR) ×4 IMPLANT
TUBING INSUFFLATION 10FT LAP (TUBING) ×2 IMPLANT

## 2012-04-30 NOTE — Interval H&P Note (Signed)
History and Physical Interval Note:  04/30/2012 9:52 AM  [name]  has presented today for surgery, with the diagnosis of symptomatic cholelithiasis.  The various methods of treatment have been discussed with the patient and family. After consideration of risks, benefits and other options for treatment, the patient has consented to    Procedure(s) (LRB) with comments: LAPAROSCOPIC CHOLECYSTECTOMY WITH INTRAOPERATIVE CHOLANGIOGRAM (N/A) as a surgical intervention .    The patient's history has been reviewed, patient examined, no change in status, stable for surgery.  I have reviewed the patient's chart and labs.  Questions were answered to the patient's satisfaction.    Velora Heckler, MD, [facility] Office: [phone]   [name]

## 2012-04-30 NOTE — Care Management Note (Signed)
    Page 1 of 1   04/30/2012     2:10:48 PM   CARE MANAGEMENT NOTE 04/30/2012  Patient:  [name], [name]   Account Number:  [id number]  Date Initiated:  04/30/2012  Documentation initiated by:  Lorenda Ishihara  Subjective/Objective Assessment:   79 yo female admitted s/p lap chole. PTA lived at home with [facility]     Action/Plan:   Home when stable   Anticipated DC Date:  05/01/2012   Anticipated DC Plan:  HOME/SELF CARE      DC Planning Services  CM consult      Choice offered to / List presented to:             Status of service:  Completed, signed off Medicare Important Message given?   (If response is "NO", the following Medicare IM given date fields will be blank) Date Medicare IM given:   Date Additional Medicare IM given:    Discharge Disposition:  HOME/SELF CARE  Per UR Regulation:  Reviewed for med. necessity/level of care/duration of stay  If discussed at Long Length of Stay Meetings, dates discussed:    Comments:

## 2012-04-30 NOTE — Transfer of Care (Signed)
Immediate Anesthesia Transfer of Care Note  Patient: [name]  Procedure(s) Performed: Procedure(s) (LRB): LAPAROSCOPIC CHOLECYSTECTOMY WITH INTRAOPERATIVE CHOLANGIOGRAM (N/A)  Patient Location: PACU  Anesthesia Type: General  Level of Consciousness: sedated, patient cooperative and responds to stimulaton  Airway & Oxygen Therapy: Patient Spontanous Breathing and Patient connected to face mask oxgen  Post-op Assessment: Report given to PACU RN and Post -op Vital signs reviewed and stable  Post vital signs: Reviewed and stable  Complications: No apparent anesthesia complications

## 2012-04-30 NOTE — Op Note (Signed)
Procedure Note  Pre-operative Diagnosis: Calculus of gallbladder with other cholecystitis, without mention of obstruction  Post-operative Diagnosis: Same  Surgeon:  Velora Heckler, MD, FACS  Assistant:  Ovidio Kin, MD, FACS  Procedure:  Laparoscopic cholecystectomy with intra-operative cholangiography  Anesthesia:  General  Indications: This patient presents with symptomatic gallbladder disease and will undergo laparoscopic cholecystectomy with intraoperative cholangiography.  Procedure Details: The patient was seen in the pre-op holding area. The risks, benefits, complications, treatment options, and expected outcomes have been discussed with the patient. The patient and/or family agreed with the proposed plan and signed the informed consent form.  The patient was taken to Operating Room, identified as [facility] and the procedure verified as Laparoscopic Cholecystectomy with Intraoperative Cholangiogram. A "time out" was completed and the above information confirmed.  Prior to the induction of general anesthesia, antibiotic prophylaxis was administered. General endotracheal anesthesia was then administered and tolerated well. After the induction, the abdomen was prepped in the usual strict aseptic fashion. The patient was in the supine position.  An incision was made in the skin near the umbilicus. The midline fascia was incised and the peritoneal cavity entered and the Hasson canula was introduced under direct vision. Hasson canula was secured with a pursestring 0-Vicryl suture. Pneumoperitoneum was then established with carbon dioxide and tolerated well without any adverse changes in the patient's vital signs. Additional trocars were introduced under direct vision along the right costal margin in the midline, mid-clavicular line, and anterior axillary line.  The gallbladder was identified and the fundus grasped and retracted cephalad. Adhesions were taken down bluntly and with  the electrocautery as needed, taking care not to injure any adjacent structures. The infundibulum was grasped and retracted laterally, exposing the peritoneum overlying the triangle of Calot. This was incised and structures exposed in a blunt fashion. The cystic duct was clearly identified and bluntly dissected circumferentially and clipped at the neck of the gallbladder.  An incision was made in the cystic duct and the cholangiogram catheter introduced. The catheter was secured using an ligaclip.  Real-time cholangiography was performed using the C-arm.  There was rapid filling of a normal caliber common bile duct.  There was reflux of contrast into the left and right hepatic ductal systems.  There was free flow distally into the duodenum without filling defect or obstruction.  Catheter was removed from the peritoneal cavity.  The cystic duct was then triply ligated with surgical clips and divided. The cystic artery was identified, dissected circumferentially, ligated with ligaclips, and divided.   The gallbladder was dissected from the liver bed with the electrocautery used for hemostasis. The gallbladder was completely removed and placed into an endocatch bag. The right upper quadrant was irrigated and inspected. Hemostasis was achieved with the electrocautery. Warm saline irrigation was utilized and was repeatedly aspirated until clear.  Pneumoperitoneum was released after viewing removal of the trocars with good hemostasis noted. The umbilical wound was irrigated and the fascia was then closed with the pursestring suture.  The skin was then closed with 4-0 Monocril subcuticular sutures and sterile dressings were applied.  Instrument, sponge, and needle counts were correct at the conclusion of the case.  The patient tolerated the procedure well.  Estimated Blood Loss: Minimal         Drains: none         Specimens: Gallbladder to pathology         Disposition: PACU - hemodynamically stable.  Condition: stable  Velora Heckler, MD, [facility] Office: [phone]

## 2012-04-30 NOTE — Preoperative (Signed)
Beta Blockers   Reason not to administer Beta Blockers:Not Applicable 

## 2012-04-30 NOTE — Anesthesia Postprocedure Evaluation (Deleted)
Anesthesia Post Note  Patient: [name]  Procedure(s) Performed: Procedure(s) (LRB): LAPAROSCOPIC CHOLECYSTECTOMY WITH INTRAOPERATIVE CHOLANGIOGRAM (N/A)  Anesthesia type: MAC  Patient location: PACU  Post pain: Pain level controlled  Post assessment: Patient's Cardiovascular Status Stable  Last Vitals:  Filed Vitals:   04/30/12 1145  BP: 161/60  Pulse: 54  Temp:   Resp: 19    Post vital signs: Reviewed and stable  Level of consciousness: alert  Complications: No apparent anesthesia complications

## 2012-04-30 NOTE — H&P (View-Only) (Signed)
General Surgery - [facility]  Chief Complaint  Patient presents with  . New Evaluation    eval for gallbladder surgery - referral from Dr. Marc Magod, [facility]    HISTORY: Patient is a 79-year-old white female referred by her gastroenterologist for consideration for laparoscopic cholecystectomy. Patient was found by CT scan to have cholelithiasis. She has had several months of symptoms including nausea, occasional emesis, fatty food intolerance, and complains of a lot of gas and abdominal discomfort. Patient has taken Prilosec without symptomatic improvement. She does use ginger which appears to give her some relief from nausea.  Patient denies previous abdominal surgery. She denies any history of John this. She denies acholic stools. Patient has undergone colonoscopy with notable diverticular disease.  CT scan of abdomen and pelvis from August 2013 showed multiple gallstones. There was no biliary dilatation.  Past Medical History  Diagnosis Date  . Seizures   . Hemorrhoid   . Diverticulosis of colon   . GERD (gastroesophageal reflux disease)   . Thyroid disease      Current Outpatient Prescriptions  Medication Sig Dispense Refill  . levothyroxine (SYNTHROID, LEVOTHROID) 75 MCG tablet Take 75 mcg by mouth daily.      . omeprazole (PRILOSEC) 20 MG capsule Take 20 mg by mouth daily.      . Probiotic Product (ALIGN) 4 MG CAPS Take by mouth.      . raloxifene (EVISTA) 60 MG tablet Take 60 mg by mouth daily.      . Vitamin D, Ergocalciferol, (DRISDOL) 50000 UNITS CAPS Take 50,000 Units by mouth.         Allergies  Allergen Reactions  . Ivp Dye (Iodinated Diagnostic Agents) Shortness Of Breath  . Red Dye      Family History  Problem Relation Age of Onset  . Cancer Mother      History   Social History  . Marital Status: Unknown    Spouse Name: N/A    Number of Children: N/A  . Years of Education: N/A   Social History Main Topics  .  Smoking status: Never Smoker   . Smokeless tobacco: None  . Alcohol Use: Yes  . Drug Use: No  . Sexually Active:    Other Topics Concern  . None   Social History Narrative  . None     REVIEW OF SYSTEMS - PERTINENT POSITIVES ONLY: Intermittent epigastric and right upper quadrant abdominal discomfort, gaseous distention, nausea, emesis. No signs of intestinal obstruction. Denies jaundice. Denies acholic stools.  EXAM: Filed Vitals:   04/15/12 1128  BP: 138/74  Pulse: 76  Temp: 99 F (37.2 C)  Resp: 18    HEENT: normocephalic; pupils equal and reactive; sclerae clear; dentition fair; mucous membranes moist NECK:  symmetric on extension; no palpable anterior or posterior cervical lymphadenopathy; no supraclavicular masses; no tenderness CHEST: clear to auscultation bilaterally without rales, rhonchi, or wheezes CARDIAC: regular rate and rhythm without significant murmur; peripheral pulses are full ABDOMEN: soft without distension; bowel sounds present; no mass; no hepatosplenomegaly; no hernia; mild tenderness to deep palpation in the epigastrium and right upper quadrant EXT:  non-tender without edema NEURO: no gross focal deficits; no sign of tremor   LABORATORY RESULTS: See [facility] (CHL-Epic) for most recent results   RADIOLOGY RESULTS: See [facility] (CHL-Epic) for most recent results   IMPRESSION: Symptomatic cholelithiasis  PLAN: I discussed the above findings at length with the patient. We reviewed her CT scan. I have recommended   laparoscopic cholecystectomy with intraoperative cholangiography. We have discussed the procedure at length and I provided her with written literature to review. We have discussed the hospital stay to be anticipated and the postoperative recovery. We discussed restrictions on her activities following the surgery. She understands and wishes to proceed. We will make arrangements for surgery in the near future.  The risks and  benefits of the procedure have been discussed at length with the patient.  The patient understands the proposed procedure, potential alternative treatments, and the course of recovery to be expected.  All of the patient's questions have been answered at this time.  The patient wishes to proceed with surgery.  Aarushi Hemric M. Jalaiya Oyster, MD, FACS General & Endocrine Surgery [facility]   Visit Diagnoses: 1. Cholelithiasis with cholecystitis     Primary Care Physician: No primary provider on file.   

## 2012-04-30 NOTE — Anesthesia Preprocedure Evaluation (Signed)
Anesthesia Evaluation  Patient identified by MRN, date of birth, ID band Patient awake    Reviewed: Allergy & Precautions, H&P , NPO status , Patient's Chart, lab work & pertinent test results, reviewed documented beta blocker date and time   Airway Mallampati: II TM Distance: >3 FB Neck ROM: full    Dental   Pulmonary neg pulmonary ROS,  breath sounds clear to auscultation        Cardiovascular negative cardio ROS  Rhythm:regular     Neuro/Psych Seizures -, Well Controlled,  negative psych ROS   GI/Hepatic Neg liver ROS, hiatal hernia, GERD-  Medicated and Controlled,  Endo/Other  negative endocrine ROS  Renal/GU negative Renal ROS  negative genitourinary   Musculoskeletal   Abdominal   Peds  Hematology negative hematology ROS (+)   Anesthesia Other Findings See surgeon's H&P   Reproductive/Obstetrics negative OB ROS                           Anesthesia Physical Anesthesia Plan  ASA: II  Anesthesia Plan: General   Post-op Pain Management:    Induction: Intravenous  Airway Management Planned: Oral ETT  Additional Equipment:   Intra-op Plan:   Post-operative Plan: Extubation in OR  Informed Consent: I have reviewed the patients History and Physical, chart, labs and discussed the procedure including the risks, benefits and alternatives for the proposed anesthesia with the patient or authorized representative who has indicated his/her understanding and acceptance.   Dental Advisory Given  Plan Discussed with: CRNA and Surgeon  Anesthesia Plan Comments:         Anesthesia Quick Evaluation

## 2012-04-30 NOTE — Anesthesia Postprocedure Evaluation (Signed)
Anesthesia Post Note  Patient: [name]  Procedure(s) Performed: Procedure(s) (LRB): LAPAROSCOPIC CHOLECYSTECTOMY WITH INTRAOPERATIVE CHOLANGIOGRAM (N/A)  Anesthesia type: general  Patient location: PACU  Post pain: Pain level controlled  Post assessment: Patient's Cardiovascular Status Stable  Last Vitals:  Filed Vitals:   04/30/12 1145  BP: 161/60  Pulse: 54  Temp:   Resp: 19    Post vital signs: Reviewed and stable  Level of consciousness: sedated  Complications: No apparent anesthesia complications

## 2012-05-01 ENCOUNTER — Encounter (HOSPITAL_COMMUNITY): Payer: Self-pay | Admitting: Surgery

## 2012-05-01 MED ORDER — HYDROCODONE-ACETAMINOPHEN 5-325 MG PO TABS: 1.0000 | ORAL_TABLET | ORAL | Status: DC | PRN

## 2012-05-01 NOTE — Progress Notes (Signed)
Pt is from [facility] and plans to return to her apt following hospital d/c.   Cori Razor LCSW [phone]

## 2012-05-01 NOTE — Discharge Summary (Signed)
Physician Discharge Summary [facility]  Patient ID: [name] MRN: [id number] DOB/AGE: 05/19/1930 79 y.o.  Admit date: 04/30/2012 Discharge date: 05/01/2012  Admission Diagnoses:  Symptomatic cholelithiasis  Discharge Diagnoses:  Principal Problem:  *Cholelithiasis with cholecystitis   Discharged Condition: good  Hospital Course: Patient admitted for observation after lap chole.  Post op course uncomplicated.  Tolerated diet.  Ambulating in halls.  Prepared for discharge on POD#1.  Consults: None  Significant Diagnostic Studies: none  Treatments: surgery: lap chole with IOC  Discharge Exam: Blood pressure 143/77, pulse 89, temperature 97.8 F (36.6 C), temperature source Oral, resp. rate 18, height 5\' 1"  (1.549 m), weight 149 lb (67.586 kg), SpO2 95.00%. HEENT - clear Chest - clear bilat Cor - RRR Abd - soft without distension; dressings dry and intact  Disposition: Home with friend to drive  Discharge Orders    Future Appointments: Provider: Department: Dept Phone: Center:   05/18/2012 3:30 PM Velora Heckler, MD [facility], [place] [phone] None     Future Orders Please Complete By Expires   Diet - low sodium heart healthy      Increase activity slowly      Discharge instructions      Comments:   [facility]  LAPAROSCOPIC SURGERY - POST-OP INSTRUCTIONS  Always review your discharge instruction sheet given to you by the facility where your surgery was performed.  A prescription for pain medication may be given to you upon discharge.  Take your pain medication as prescribed.  If narcotic pain medicine is not needed, then you may take acetaminophen (Tylenol) or ibuprofen (Advil) as needed.  Take your usually prescribed medications unless otherwise directed.  If you need a refill on your pain medication, please contact your pharmacy.  They will contact our office to request authorization. Prescriptions will not  be filled after 5 P.M. or on weekends.  You should follow a light diet the first few days after arrival home, such as soup and crackers or toast.  Be sure to include plenty of fluids daily.  Most patients will experience some swelling and bruising in the area of the incisions.  Ice packs will help.  Swelling and bruising can take several days to resolve.   It is common to experience some constipation if taking pain medication after surgery.  Increasing fluid intake and taking a stool softener (such as Colace) will usually help or prevent this problem from occurring.  A mild laxative (Milk of Magnesia or Miralax) should be taken according to package instructions if there are no bowel movements after 48 hours.  Unless discharge instructions indicate otherwise, you may remove your bandages 24-48 hours after surgery, and you may shower at that time.  You may have steri-strips (small skin tapes) in place directly over the incision.  These strips should be left on the skin for 7-10 days.  If your surgeon used skin glue on the incision, you may shower in 24 hours.  The glue will flake off over the next 2-3 weeks.  Any sutures or staples will be removed at the office during your follow-up visit.  ACTIVITIES:  You may resume regular (light) daily activities beginning the next day-such as daily self-care, walking, climbing stairs-gradually increasing activities as tolerated.  You may have sexual intercourse when it is comfortable.  Refrain from any heavy lifting or straining until approved by your doctor.  You may drive when you are no longer taking prescription pain medication, you  can comfortably wear a seatbelt, and you can safely maneuver your car and apply brakes.  You should see your doctor in the office for a follow-up appointment approximately 2-3 weeks after your surgery.  Make sure that you call for this appointment within a day or two after you arrive home to insure a convenient appointment  time.  WHEN TO CALL YOUR DOCTOR: Fever over 101.0 Inability to urinate Continued bleeding from incision Increased pain, redness, or drainage from the incision Increasing abdominal pain  The clinic staff is available to answer your questions during regular business hours.  Please don't hesitate to call and ask to speak to one of the nurses for clinical concerns.  If you have a medical emergency, go to the nearest emergency room or call 911.  A surgeon from [facility] is always on call for the hospital.  Velora Heckler, MD, [facility] Office: [phone] Toll Free:  [phone] FAX [phone]  Web site: [email or web address]   Remove dressing in 24 hours          Medication List     As of 05/01/2012  3:42 PM    TAKE these medications         ALIGN 4 MG Caps   Take 1 capsule by mouth daily.      PHILLIPS COLON HEALTH Caps   Take 1 capsule by mouth daily.      GENTEAL OP   Apply 1 drop to eye as needed. For dry eyes      HYDROcodone-acetaminophen 5-325 MG per tablet   Commonly known as: NORCO/VICODIN   Take 1-2 tablets by mouth every 4 (four) hours as needed for pain.      levothyroxine 75 MCG tablet   Commonly known as: SYNTHROID, LEVOTHROID   Take 75 mcg by mouth every morning.      omeprazole-sodium bicarbonate 40-1100 MG per capsule   Commonly known as: ZEGERID   Take 1 capsule by mouth daily before breakfast.      PRESERVISION AREDS 2 Caps   Take 1 capsule by mouth 2 (two) times daily.      pyridOXINE 100 MG tablet   Commonly known as: VITAMIN B-6   Take 200 mg by mouth daily.      raloxifene 60 MG tablet   Commonly known as: EVISTA   Take 60 mg by mouth every morning.      vitamin C 500 MG tablet   Commonly known as: ASCORBIC ACID   Take 500 mg by mouth daily.      Vitamin D (Ergocalciferol) 50000 UNITS Caps   Commonly known as: DRISDOL   Take 50,000 Units by mouth every 7 (seven) days. Takes on  sundays         Velora Heckler, MD, [facility] Office: [phone]   Signed: Velora Heckler 05/01/2012, 3:42 PM

## 2012-05-01 NOTE — Progress Notes (Signed)
Patient discharged via wheelchair. Rx for vicodin given. States understanding of discharge instructions.  

## 2012-05-04 ENCOUNTER — Telehealth (INDEPENDENT_AMBULATORY_CARE_PROVIDER_SITE_OTHER): Payer: Self-pay

## 2012-05-04 NOTE — Telephone Encounter (Signed)
No answer at phone # given. Unable to reach pt po.

## 2012-05-18 ENCOUNTER — Encounter (INDEPENDENT_AMBULATORY_CARE_PROVIDER_SITE_OTHER): Payer: Self-pay | Admitting: Surgery

## 2012-05-18 ENCOUNTER — Ambulatory Visit (INDEPENDENT_AMBULATORY_CARE_PROVIDER_SITE_OTHER): Payer: Medicare Other | Admitting: Surgery

## 2012-05-18 VITALS — BP 124/78 | HR 80 | Temp 98.3°F | Resp 18 | Ht 60.0 in | Wt 147.0 lb

## 2012-05-18 DIAGNOSIS — K8010 Calculus of gallbladder with chronic cholecystitis without obstruction: Secondary | ICD-10-CM

## 2012-05-18 NOTE — Progress Notes (Signed)
General Surgery [facility]  Visit Diagnoses: 1. Cholelithiasis with cholecystitis     HISTORY: Patient returns for her first postoperative visit having undergone laparoscopic cholecystectomy on 04/30/2012. Final pathology shows chronic cholecystitis and cholelithiasis. Postoperatively she has done well. However she describes heartburn symptoms. She has been taking Zegerid given to her by Dr. Vida Rigger.  She notes marked symptomatic improvement while on this medication.  EXAM: Surgical incisions are well-healed. No sign of infection. No sign of herniation. Palpation in the right upper quadrant shows no mass and no tenderness.  IMPRESSION: #1 status post laparoscopic cholecystectomy for symptomatic cholelithiasis and cholecystitis #2 gastroesophageal reflux with hiatal hernia  PLAN: Patient will begin applying topical creams to her incisions. She will likely need to remain on a proton pump inhibitor. I have asked her to contact her gastroenterologist for a refill of her omeprazole.  Patient will return to see me as needed.  Velora Heckler, MD, FACS General & Endocrine Surgery [facility]

## 2012-05-18 NOTE — Patient Instructions (Signed)
  COCOA BUTTER & VITAMIN E CREAM  (Palmer's or other brand)  Apply cocoa butter/vitamin E cream to your incision 2 - 3 times daily.  Massage cream into incision for one minute with each application.  Use sunscreen (50 SPF or higher) for first 6 months after surgery if area is exposed to sun.  You may substitute Mederma or other scar reducing creams as desired.   

## 2012-06-24 ENCOUNTER — Encounter (INDEPENDENT_AMBULATORY_CARE_PROVIDER_SITE_OTHER): Payer: Self-pay

## 2013-01-14 ENCOUNTER — Ambulatory Visit (INDEPENDENT_AMBULATORY_CARE_PROVIDER_SITE_OTHER): Payer: Medicare Other | Admitting: *Deleted

## 2013-01-14 DIAGNOSIS — Z23 Encounter for immunization: Secondary | ICD-10-CM

## 2013-01-14 NOTE — Progress Notes (Signed)
  Subjective:    Patient ID: [name], female    DOB: [birth year], 79 y.o.   MRN: [id number]  HPI    Review of Systems     Objective:   Physical Exam        Assessment & Plan:  Patient here for flu shot only.

## 2013-09-08 ENCOUNTER — Other Ambulatory Visit: Payer: Self-pay | Admitting: Gastroenterology

## 2014-01-04 ENCOUNTER — Ambulatory Visit (INDEPENDENT_AMBULATORY_CARE_PROVIDER_SITE_OTHER): Payer: Medicare Other

## 2014-01-04 ENCOUNTER — Other Ambulatory Visit: Payer: Self-pay | Admitting: Physician Assistant

## 2014-01-04 DIAGNOSIS — Z23 Encounter for immunization: Secondary | ICD-10-CM

## 2015-10-02 ENCOUNTER — Telehealth: Payer: Self-pay | Admitting: *Deleted

## 2015-10-02 ENCOUNTER — Telehealth: Payer: Self-pay | Admitting: Neurology

## 2015-10-02 ENCOUNTER — Ambulatory Visit: Payer: Medicare Other | Admitting: Neurology

## 2015-10-02 NOTE — Telephone Encounter (Signed)
no showed new patient appt 

## 2015-10-02 NOTE — Telephone Encounter (Signed)
A [place] Engineer, structural came to the desk to state that pt arrived at [employer].  She could not locate out office and was very aggravated because she didn't want to be charged a fee for missing her appt.  There was no other appt available at the time the officer was here.  Gave him our card to give to pt so that she can call to r/s. Advised that we will call and follow-up with patient.-SLB

## 2015-10-03 ENCOUNTER — Encounter: Payer: Self-pay | Admitting: Neurology

## 2015-12-06 ENCOUNTER — Telehealth: Payer: Self-pay | Admitting: Neurology

## 2015-12-06 ENCOUNTER — Encounter: Payer: Self-pay | Admitting: Neurology

## 2015-12-06 ENCOUNTER — Ambulatory Visit (INDEPENDENT_AMBULATORY_CARE_PROVIDER_SITE_OTHER): Payer: Medicare Other | Admitting: Neurology

## 2015-12-06 VITALS — BP 131/79 | HR 97 | Ht 60.0 in | Wt 124.0 lb

## 2015-12-06 DIAGNOSIS — Z79899 Other long term (current) drug therapy: Secondary | ICD-10-CM

## 2015-12-06 DIAGNOSIS — G7089 Other specified myoneural disorders: Secondary | ICD-10-CM | POA: Diagnosis not present

## 2015-12-06 DIAGNOSIS — G7000 Myasthenia gravis without (acute) exacerbation: Secondary | ICD-10-CM

## 2015-12-06 DIAGNOSIS — G7001 Myasthenia gravis with (acute) exacerbation: Secondary | ICD-10-CM

## 2015-12-06 NOTE — Patient Instructions (Signed)
Remember to drink plenty of fluid, eat healthy meals and do not skip any meals. Try to eat protein with a every meal and eat a healthy snack such as fruit or nuts in between meals. Try to keep a regular sleep-wake schedule and try to exercise daily, particularly in the form of walking, 20-30 minutes a day, if you can.   As far as your medications are concerned, I would like to suggest: I will taper the steroids and will call POA to discuss in length before providing instructions to patient's residence  As far as diagnostic testing: Labs  I would like to see you back in 3 months, sooner if we need to. Please call us with any interim questions, concerns, problems, updates or refill requests.   Our phone number is [phone]. We also have an after hours call service for urgent matters and there is a physician on-call for urgent questions. For any emergencies you know to call 911 or go to the nearest emergency room

## 2015-12-06 NOTE — Progress Notes (Addendum)
[facility]    Provider:  Dr Jaynee Eagles Referring Provider: Reynold Bowen, MD Primary Care Physician:  Sheela Stack, MD  CC:  Myasthenia Gravis  HPI:  [name] is a 82 y.o. female here as a referral from Dr. Forde Dandy for myasthenia gravis. PMHx macular degeneration of both eyes, myasthenia gravis, hypothyroidism. Patient was in [place] at [facility]. Patient is a poor historian and she says she had "to learn how to swallow food". She cannot answer questions. She says they "had to keep it out of my lungs". She denies problems breathing, no problems swallowing. She was visiting a good friend. At the end of may she drove to visit her friend Lelon Frohlich friend for years. She got lost going down there. It took longer than usual. The next day she went to lunch, eyelids drooped, patient gets very upset, today, irritable due to being unable to answer questions and can;t ive details. she just says she was in the hospital and was very ill. She had blood transfusions. Currently she has no swallowing problems and no vision problems. She lives in [place] in the memory unit. She has had no problems with her glucose.   Per her POA, patient left to visit a friend and she was at her baseline. She lives at [place].She went to visit a childhood friend and patient got lost going to [place] driving herself. She spent 5 hours to make a 2-hour trip. When she got there they visited and the next day more friends came over and watched movies and 3 days into the visit they were having lunch in a restaurant and acutely had difficulty swallowing with ptosis. They went to Urgent care and they examined her and sent her to the hospital and she was there for 2 days and went to [facility] to main hospital for 2 weeks. And brought back to [facility] to [employer] and went to snf for 2 weeks. She went back to her apartment and no reason not to let her have her car back. On July 10th she got very lost coming  to [facility] for appt and did not make it there and POA took her keys. She was very angry and wouldn't talk to her POA who has been a close friend for many years. Brother had dementia and died recently, she has nephews not involved in her care. A few weeks later she was wandering at 5am and she said she wanted to see the security guard because he would tell her about the truth about how long she was hospitalized.   Reviewed notes, labs and imaging from outside physicians, which showed:  limited records show that patient presented to an outside hospital on May 25 with complaints of difficulty swallowing and slurred speech. Patient was seen several days prior and thought to have angioedema discharge home. Patient symptoms did not resolve and she returned to the ED and admitted. Patient was noted to have profound dysphasia and difficulty swallowing. Due to concern for myasthenia gravis she was started on high-dose steroids and IV Mestinon with no improvement in symptoms. She was transferred to the main for further neurologic evaluation. Neurology was consulted and PLEX was initiated for a total of 5-7 treatments. Denies any antibodies returned elevated. She was intubated for airway protection. She completed 5 plasma exchange treatments. Her hypernatremia and hypokalemia resolved. This is in the setting with klebsiella tracheobronchitis. She has a past medical history of hypothyroidism. She was discharged on prednisone 20 mg 3 times a day.  MRI of the brain was unremarkable. Patient worked with physical therapy and occupational therapy and improved.   Review of Systems: Patient complains of symptoms per HPI as well as the following symptoms: easy bruising, swelling in the legs, runny nose. Pertinent negatives per HPI. All others negative.   Social History   Social History  . Marital status: Single    Spouse name: N/A  . Number of children: 0  . Years of education: BA   Occupational History  . Retired     Social History Main Topics  . Smoking status: Never Smoker  . Smokeless tobacco: Never Used  . Alcohol use No  . Drug use: No  . Sexual activity: Not Currently   Other Topics Concern  . Not on file   Social History Narrative   Lives at [place]   Phone: [phone]   Fax: [phone]   Caffeine use: No soda   Hot tea ocass   Coffee-decaf       Family History  Problem Relation Age of Onset  . Cancer Mother   . Heart attack Father   . Prostate cancer Brother     Past Medical History:  Diagnosis Date  . Arthritis   . Diverticulosis of colon   . GERD (gastroesophageal reflux disease)   . H/O hiatal hernia   . Hemorrhoid   . Macular degeneration   . Osteopenia   . Seizures ([place])    X1 AT AGE 89  - NONE SINCE  . Thyroid disease     Past Surgical History:  Procedure Laterality Date  . BREAST SURGERY     BREAST BX   . CARPAL TUNNEL RELEASE    . CHOLECYSTECTOMY  04/30/2012   Procedure: LAPAROSCOPIC CHOLECYSTECTOMY WITH INTRAOPERATIVE CHOLANGIOGRAM;  Surgeon: Earnstine Regal, MD;  Location: [facility];  Service: General;  Laterality: N/A;  . COLONOSCOPY    . EYE SURGERY     CATARACTS  . FEMUR FRACTURE SURGERY    . FOOT SURGERY    . JOINT REPLACEMENT  1990 / 2011   BIL KNEE REPLACEDMENTS  . LASER ABLATION     FOR PHLEBITIS    Current Outpatient Prescriptions  Medication Sig Dispense Refill  . cycloSPORINE (RESTASIS) 0.05 % ophthalmic emulsion Place 1 drop into both eyes 2 (two) times daily.    Marland Kitchen levothyroxine (SYNTHROID, LEVOTHROID) 75 MCG tablet Take 75 mcg by mouth every morning.    . Multiple Vitamins-Minerals (PRESERVISION AREDS 2) CAPS Take 1 capsule by mouth 2 (two) times daily.    . predniSONE (DELTASONE) 20 MG tablet Take 20 mg by mouth 3 (three) times daily.    . Probiotic Product (ALIGN) 4 MG CAPS Take 1 capsule by mouth daily.     Marland Kitchen pyridOXINE (VITAMIN B-6) 100 MG tablet Take 200 mg by mouth daily.    . raloxifene (EVISTA) 60  MG tablet Take 60 mg by mouth every morning.     . Vitamin D, Ergocalciferol, (DRISDOL) 50000 UNITS CAPS Take 50,000 Units by mouth every 7 (seven) days. Takes on sundays    . HYDROcodone-acetaminophen (NORCO/VICODIN) 5-325 MG per tablet Take 1-2 tablets by mouth every 4 (four) hours as needed for pain. (Patient not taking: Reported on 12/06/2015) 20 tablet 1  . Hypromellose (GENTEAL OP) Apply 1 drop to eye as needed. For dry eyes    . LORazepam (ATIVAN) 0.5 MG tablet Take 0.5 mg by mouth as needed for anxiety.    Marland Kitchen  omeprazole-sodium bicarbonate (ZEGERID) 40-1100 MG per capsule Take 1 capsule by mouth daily before breakfast.     No current facility-administered medications for this visit.     Allergies as of 12/06/2015 - Review Complete 05/18/2012  Allergen Reaction Noted  . Ivp dye [iodinated diagnostic agents] Shortness Of Breath 04/14/2012  . Red dye  04/14/2012    Vitals: BP 131/79 (BP Location: Right Arm, Patient Position: Sitting, Cuff Size: Normal)   Pulse 97   Ht 5' (1.524 m)   Wt 124 lb (56.2 kg)   BMI 24.22 kg/m  Last Weight:  Wt Readings from Last 1 Encounters:  12/06/15 124 lb (56.2 kg)   Last Height:   Ht Readings from Last 1 Encounters:  12/06/15 5' (1.524 m)   Physical exam: Exam: Gen: NAD, slightly agitated likely due to dementia                    CV: RRR, no MRG. No Carotid Bruits. No peripheral edema, warm, nontender Eyes: Conjunctivae clear without exudates or hemorrhage  Neuro: Detailed Neurologic Exam  Speech:    Speech is normal; fluent and spontaneous with normal comprehension.  Cognition:     The patient is oriented to person    recent and remote memory impaired;     language fluent;     Impaired attention, concentration,     fund of knowledge impaired Cranial Nerves:    The pupils are equal, round, and reactive to light. Attempted funduscopic exam could not visualize due to small pupils. Visual fields are full to finger confrontation. Impaired  upgaze with impaired smooth pursuit. Trigeminal sensation is intact and the muscles of mastication are normal. The face is symmetric. The palate elevates in the midline. Hearing intact. Voice is normal. Shoulder shrug is normal. The tongue has normal motion without fasciculations.   Coordination:    No dysmetria  Gait:    Uses a walker, mildly stooped, slow and short strides  Motor Observation:    No asymmetry, no atrophy, and no involuntary movements noted. Tone:    Normal muscle tone.    Posture:     Mildly stooped    Strength: difficulty due to dementia but appears to have mild proximal weakness      Sensation: intact to LT     Reflex Exam:  DTR's: brisk uppers, attempted but she refused lowers      Toes:    Attempted she refused.  Clonus:    Clonus is absent.   Assessment/Plan:  82 year old female here for follow-up after myasthenic crisis. She has been on high-dose 60 mg of prednisone since being discharged in June. Patient is in a memory unit and there appears to be significant dementia. She is here with her healthcare power of attorney and I had a long discussion regarding myasthenia gravis treatment.  Had a lengthy discussion about her myasthenia gravis. Discussed benefits versus risks of long-term steroids versus immunosuppressants. As we decrease steroids there is a risk of recurrent myasthenic crisis.    We had a discussion about different medication strategies, possible adverse effects and strategies to manage them, expected time course of benefits in medication management which include hours to days for Mestinon, weeks to many months for prednisone, and many months to a year more for Azathioprine.  In mild to moderate generalized myasthenia gravis, Mestinon may be the only treatment needed. A cholinergic crisis, in which weakness is worsened by increased doses of the Mestinon, rarely occurs. The concomitant use  of prednisone and sometimes another immunosuppressant is  often required. In more severe myasthenia gravis, immunosuppression and sometimes immunomodulation should be started at the same time as Mestinon.  Discussed very slowly decreasing patient's steroids over many months. Discussed long-term steroid use which can cause significant and very problems such as diabetes, high blood pressure, osteoporosis and other conditions. Discussed immunosuppressants neck and also has significant side effects as well as Mestinon.  Medications that worsen myasthenia gravis include beta blockers and calcium channel blockers, and aminoglycosides, macrolides, or fkuoroquinolones and people should avoid not polarizing neuromuscular blocking agents and are less sensitive to depolarizing neuromuscular blocking agents. Spinal or local anesthetics are generally safer options.   Need to taper steroids very slowly. Watch for symptoms of recurrence.  At some point may start mestinon, doesn't seem to need it now maybe at next appointment  CT of chest? Will request records and see if this was completed for evaluation of thymoma.  Recommendation is to taper by 5-10mg  a month until at 20mg  daily at which time can taper even slower. Tapering too fast increases risk of recurrence.   Month one of Taper: 50mg  daily. Prednisone can be taken all at once in the morning with breakfast Month two of taper: 40mg  daily Month three of Taper: 30mg  daily Month four of Taper: 25mg  daily Month five of taper: 20mg  daily and hold at which point we can reassess.   [phone] POA  Follow up 8 weeks  Cc: Dr. Forde Dandy and Dr. Felipa Eth   Addendum 05/05/2016: Reviewed notes from [place] [facility] for patient's hospitalization May 2017. She presented with difficulty swallowing and slurred speech. She was initially seen several days prior and thought to have angioedema discharge home. She was noted to have profound dysphasia and difficulty swallowing with concern for myasthenia gravis. She was  started on high-dose steroids and Mestinon with no improvement. She was transferred to [place] for further neurologic evaluation. Plavix was initiated. Diagnosed with myasthenia gravis. She was intubated for airway protection and plasmapheresis. MRI of the brain was unremarkable per report. She underwent 5 treatments of plasma exchange and placed on high-dose IV steroids. Acetylcholine receptor antibodies were elevated and this was consistent with the diagnosis of myasthenia gravis.  Sarina Ill, MD  [facility] [address] [place], [place] [address]  Phone [phone] Fax [phone]  A total of 60 minutes was spent in with this patient and her power of attorney face-to-face. Over half this time was spent on counseling patient and her power of attorney on the myasthenia gravis diagnosis and different therapeutic options available.

## 2015-12-06 NOTE — Telephone Encounter (Signed)
[facility]/ [facility] called , they did not get yellow sheet back from pts appt today. Please call with any changes . [phone]

## 2015-12-06 NOTE — Telephone Encounter (Signed)
Dr Jaynee Eagles- let me know when you are finished and I am happy to fax. Thank you  Called [facility] back. Advised once Dr Jaynee Eagles finished her note, we can fax that over for their records. She verbalized understanding.

## 2015-12-12 DIAGNOSIS — G7001 Myasthenia gravis with (acute) exacerbation: Secondary | ICD-10-CM | POA: Insufficient documentation

## 2015-12-12 DIAGNOSIS — G7000 Myasthenia gravis without (acute) exacerbation: Secondary | ICD-10-CM | POA: Insufficient documentation

## 2015-12-12 LAB — COMPREHENSIVE METABOLIC PANEL
ALT: 81 IU/L — AB (ref 0–32)
AST: 24 IU/L (ref 0–40)
Albumin/Globulin Ratio: 2.1 (ref 1.2–2.2)
Albumin: 3.9 g/dL (ref 3.5–4.7)
Alkaline Phosphatase: 72 IU/L (ref 39–117)
BUN/Creatinine Ratio: 23 (ref 12–28)
BUN: 23 mg/dL (ref 8–27)
Bilirubin Total: 0.7 mg/dL (ref 0.0–1.2)
CALCIUM: 9.6 mg/dL (ref 8.7–10.3)
CO2: 25 mmol/L (ref 18–29)
CREATININE: 0.98 mg/dL (ref 0.57–1.00)
Chloride: 99 mmol/L (ref 96–106)
GFR calc Af Amer: 62 mL/min/{1.73_m2} (ref >59)
GFR, EST NON AFRICAN AMERICAN: 54 mL/min/{1.73_m2} — AB (ref >59)
GLUCOSE: 144 mg/dL — AB (ref 65–99)
Globulin, Total: 1.9 g/dL (ref 1.5–4.5)
Potassium: 6.3 mmol/L — ABNORMAL HIGH (ref 3.5–5.2)
Sodium: 140 mmol/L (ref 134–144)
Total Protein: 5.8 g/dL — ABNORMAL LOW (ref 6.0–8.5)

## 2015-12-12 LAB — CBC
HEMOGLOBIN: 14.6 g/dL (ref 11.1–15.9)
Hematocrit: 43.7 % (ref 34.0–46.6)
MCH: 30.6 pg (ref 26.6–33.0)
MCHC: 33.4 g/dL (ref 31.5–35.7)
MCV: 92 fL (ref 79–97)
Platelets: 193 10*3/uL (ref 150–379)
RBC: 4.77 x10E6/uL (ref 3.77–5.28)
RDW: 16.2 % — ABNORMAL HIGH (ref 12.3–15.4)
WBC: 10.4 10*3/uL (ref 3.4–10.8)

## 2015-12-12 LAB — THIOPURINE METHYLTRANSFERASE (TPMT), RBC: TPMT Activity:: 24.6 Units/mL RBC

## 2015-12-12 LAB — HEMOGLOBIN A1C
ESTIMATED AVERAGE GLUCOSE: 114 mg/dL
HEMOGLOBIN A1C: 5.6 % (ref 4.8–5.6)

## 2015-12-14 ENCOUNTER — Encounter: Payer: Self-pay | Admitting: *Deleted

## 2015-12-14 NOTE — Progress Notes (Signed)
Faxed Dr Cathren Laine recent office note and completed form for findings/recommendations from facility [facility]) where pt lives. Fax: [phone]. Received confirmation.

## 2016-01-08 ENCOUNTER — Other Ambulatory Visit: Payer: Self-pay | Admitting: Geriatric Medicine

## 2016-01-08 ENCOUNTER — Ambulatory Visit
Admission: RE | Admit: 2016-01-08 | Discharge: 2016-01-08 | Disposition: A | Discharge status: Home / Self Care | Payer: Medicare Other | Source: Ambulatory Visit | Attending: Geriatric Medicine | Admitting: Geriatric Medicine

## 2016-01-08 DIAGNOSIS — M549 Dorsalgia, unspecified: Secondary | ICD-10-CM

## 2016-01-10 ENCOUNTER — Other Ambulatory Visit: Payer: Self-pay | Admitting: Geriatric Medicine

## 2016-01-10 DIAGNOSIS — E039 Hypothyroidism, unspecified: Secondary | ICD-10-CM

## 2016-01-11 ENCOUNTER — Other Ambulatory Visit: Payer: Self-pay | Admitting: Geriatric Medicine

## 2016-01-11 DIAGNOSIS — T1590XA Foreign body on external eye, part unspecified, unspecified eye, initial encounter: Secondary | ICD-10-CM

## 2016-01-11 DIAGNOSIS — T148XXA Other injury of unspecified body region, initial encounter: Secondary | ICD-10-CM

## 2016-01-23 ENCOUNTER — Ambulatory Visit
Admission: RE | Admit: 2016-01-23 | Discharge: 2016-01-23 | Disposition: A | Discharge status: Home / Self Care | Payer: Medicare Other | Source: Ambulatory Visit | Attending: Geriatric Medicine | Admitting: Geriatric Medicine

## 2016-01-23 ENCOUNTER — Other Ambulatory Visit: Payer: Medicare Other

## 2016-01-23 DIAGNOSIS — T148XXA Other injury of unspecified body region, initial encounter: Secondary | ICD-10-CM

## 2016-01-23 DIAGNOSIS — T1590XA Foreign body on external eye, part unspecified, unspecified eye, initial encounter: Secondary | ICD-10-CM

## 2016-02-19 ENCOUNTER — Ambulatory Visit (INDEPENDENT_AMBULATORY_CARE_PROVIDER_SITE_OTHER): Payer: Medicare Other | Admitting: Neurology

## 2016-02-19 ENCOUNTER — Encounter: Payer: Self-pay | Admitting: Neurology

## 2016-02-19 VITALS — BP 128/87 | HR 96 | Ht 60.0 in | Wt 123.0 lb

## 2016-02-19 DIAGNOSIS — G7000 Myasthenia gravis without (acute) exacerbation: Secondary | ICD-10-CM | POA: Diagnosis not present

## 2016-02-19 NOTE — Progress Notes (Signed)
[id number]    Provider:  Dr Jaynee Eagles Referring Provider: Reynold Bowen, MD Primary Care Physician:  Sheela Stack, MD  CC:  Myasthenia Gravis  Interval history:  We are decreasing her steroids she is on 30mg  now for one month, next will take 25mg  for one month and then 20mg . No worsening of weakness. Still walking and doing well. No double vision. No ptosis, no difficulty swallowing, no changes in voice quality. She is here with her POA. Patient appears to suffer from significant dementia. Discussed options at this point, can start steroid-sparing agents but they decline when discussing side effects. There are significant risks with long-term steroids as well. At this point will continue to decrease steroids very slowly and try to get her down to the lowest dose possible. They are to call if symptoms worsen.  HPI:  [name] is a 82 y.o. female here as a referral from Dr. Forde Dandy for myasthenia gravis. PMHx macular degeneration of both eyes, myasthenia gravis, hypothyroidism. Patient was in [place] at [facility]. Patient is a poor historian and she says she had "to learn how to swallow food". She cannot answer questions. She says they "had to keep it out of my lungs". She denies problems breathing, no problems swallowing. She was visiting a good friend. At the end of may she drove to visit her friend Lelon Frohlich friend for years. She got lost going down there. It took longer than usual. The next day she went to lunch, eyelids drooped, patient gets very upset, today, irritable due to being unable to answer questions and can;t ive details. she just says she was in the hospital and was very ill. She had blood transfusions. Currently she has no swallowing problems and no vision problems. She lives in [place] in the memory unit. She has had no problems with her glucose.   Per her POA, patient left to visit a friend and she was at her baseline. She lives at [place].She  went to visit a childhood friend and patient got lost going to [place] driving herself. She spent 5 hours to make a 2-hour trip. When she got there they visited and the next day more friends came over and watched movies and 3 days into the visit they were having lunch in a restaurant and acutely had difficulty swallowing with ptosis. They went to Urgent care and they examined her and sent her to the hospital and she was there for 2 days and went to [facility] to main hospital for 2 weeks. And brought back to [facility] to [employer] and went to snf for 2 weeks. She went back to her apartment and no reason not to let her have her car back. On July 10th she got very lost coming to [facility] for appt and did not make it there and POA took her keys. She was very angry and wouldn't talk to her POA who has been a close friend for many years. Brother had dementia and died recently, she has nephews not involved in her care. A few weeks later she was wandering at 5am and she said she wanted to see the security guard because he would tell her about the truth about how long she was hospitalized.   Reviewed notes, labs and imaging from outside physicians, which showed:  limited records show that patient presented to an outside hospital on May 25 with complaints of difficulty swallowing and slurred speech. Patient was seen several days prior and thought to have angioedema discharge home.  Patient symptoms did not resolve and she returned to the ED and admitted. Patient was noted to have profound dysphasia and difficulty swallowing. Due to concern for myasthenia gravis she was started on high-dose steroids and IV Mestinon with no improvement in symptoms. She was transferred to the main for further neurologic evaluation. Neurology was consulted and PLEX was initiated for a total of 5-7 treatments. Denies any antibodies returned elevated. She was intubated for airway protection. She completed 5 plasma exchange treatments. Her  hypernatremia and hypokalemia resolved. This is in the setting with klebsiella tracheobronchitis. She has a past medical history of hypothyroidism. She was discharged on prednisone 20 mg 3 times a day. MRI of the brain was unremarkable. Patient worked with physical therapy and occupational therapy and improved.   Review of Systems: Patient complains of symptoms per HPI as well as the following symptoms: easy bruising, swelling in the legs, runny nose. Pertinent negatives per HPI. All others negative.   Social History   Social History  . Marital status: Single    Spouse name: N/A  . Number of children: 0  . Years of education: BA   Occupational History  . Retired    Social History Main Topics  . Smoking status: Never Smoker  . Smokeless tobacco: Never Used  . Alcohol use No  . Drug use: No  . Sexual activity: Not Currently   Other Topics Concern  . Not on file   Social History Narrative   Lives at [place]   Phone: [phone]   Fax: [phone]      Uses pharmacy at [facility]:    Pharmacy number: [phone]   Fax: [phone]      Caffeine use: No soda   Hot tea ocass   Coffee-decaf       Family History  Problem Relation Age of Onset  . Cancer Mother   . Heart attack Father   . Prostate cancer Brother     Past Medical History:  Diagnosis Date  . Arthritis   . Diverticulosis of colon   . GERD (gastroesophageal reflux disease)   . H/O hiatal hernia   . Hemorrhoid   . Macular degeneration   . Osteopenia   . Seizures ([place])    X1 AT AGE 52  - NONE SINCE  . Thyroid disease     Past Surgical History:  Procedure Laterality Date  . BREAST SURGERY     BREAST BX   . CARPAL TUNNEL RELEASE    . CHOLECYSTECTOMY  04/30/2012   Procedure: LAPAROSCOPIC CHOLECYSTECTOMY WITH INTRAOPERATIVE CHOLANGIOGRAM;  Surgeon: Earnstine Regal, MD;  Location: [facility];  Service: General;  Laterality: N/A;  . COLONOSCOPY    . EYE SURGERY      CATARACTS  . FEMUR FRACTURE SURGERY    . FOOT SURGERY    . JOINT REPLACEMENT  1990 / 2011   BIL KNEE REPLACEDMENTS  . LASER ABLATION     FOR PHLEBITIS    Current Outpatient Prescriptions  Medication Sig Dispense Refill  . acetaminophen (TYLENOL) 325 MG tablet Take 650 mg by mouth every 4 (four) hours as needed.    Marland Kitchen alendronate (FOSAMAX) 70 MG tablet Take 70 mg by mouth once a week. Take with a full glass of water on an empty stomach.    . busPIRone (BUSPAR) 5 MG tablet Take 5 mg by mouth 3 (three) times daily.    . cycloSPORINE (RESTASIS) 0.05 % ophthalmic emulsion Place  1 drop into both eyes 2 (two) times daily.    . furosemide (LASIX) 20 MG tablet Take 20 mg by mouth daily.    . Hypromellose (GENTEAL OP) Apply 1 drop to eye as needed. For dry eyes    . levothyroxine (SYNTHROID, LEVOTHROID) 75 MCG tablet Take 75 mcg by mouth every morning.    Marland Kitchen LORazepam (ATIVAN) 0.5 MG tablet Take 0.5 mg by mouth as needed for anxiety.    . Multiple Vitamins-Minerals (PRESERVISION AREDS 2) CAPS Take 1 capsule by mouth 2 (two) times daily.    . predniSONE (DELTASONE) 20 MG tablet Take 10 mg by mouth 2 (two) times daily. For 30 days. Start date: 02/14/16    . Probiotic Product (ALIGN) 4 MG CAPS Take 1 capsule by mouth daily.     Marland Kitchen pyridOXINE (VITAMIN B-6) 100 MG tablet Take 100 mg by mouth 2 (two) times daily.     . Vitamin D, Ergocalciferol, (DRISDOL) 50000 UNITS CAPS Take 50,000 Units by mouth every 7 (seven) days. Takes on sundays     No current facility-administered medications for this visit.     Allergies as of 02/19/2016 - Review Complete 12/12/2015  Allergen Reaction Noted  . Ivp dye [iodinated diagnostic agents] Shortness Of Breath 04/14/2012  . Red dye  04/14/2012    Vitals: BP 128/87 (BP Location: Right Arm, Patient Position: Sitting, Cuff Size: Normal)   Pulse 96   Ht 5' (1.524 m)   Wt 123 lb (55.8 kg)   BMI 24.02 kg/m  Last Weight:  Wt Readings from Last 1 Encounters:    02/19/16 123 lb (55.8 kg)   Last Height:   Ht Readings from Last 1 Encounters:  02/19/16 5' (1.524 m)    Speech:    Speech is normal; fluent and spontaneous with normal comprehension.  Cognition:     The patient is oriented to person    recent and remote memory impaired;     language fluent;     Impaired attention, concentration,     fund of knowledge impaired Cranial Nerves:    The pupils are equal, round, and reactive to light. Attempted funduscopic exam could not visualize due to small pupils. Visual fields are full to finger confrontation. Impaired upgaze with impaired smooth pursuit. Trigeminal sensation is intact and the muscles of mastication are normal. The face is symmetric. The palate elevates in the midline. Hearing intact. Voice is normal. Shoulder shrug is normal. The tongue has normal motion without fasciculations.   Coordination:    No dysmetria  Gait:    Uses a walker, mildly stooped, slow and short strides  Motor Observation:    No asymmetry, no atrophy, and no involuntary movements noted. Tone:    Normal muscle tone.    Posture:     Mildly stooped    Strength: difficulty due to dementia but appears to have mild proximal weakness      Sensation: intact to LT     Reflex Exam:  DTR's: brisk uppers, attempted but she refused lowers      Toes:    Attempted she refused.  Clonus:    Clonus is absent.   Assessment/Plan:  82 year old female here for follow-up after myasthenic crisis. She has been on high-dose 60 mg of prednisone since being discharged in June and is now on 30mg  of her taper. Patient is in a memory unit and there appears to be significant dementia. She is here with her healthcare power of attorney and I had a  long discussion regarding myasthenia gravis treatment again.  Had a lengthy discussion about her myasthenia gravis. Discussed benefits versus risks of long-term steroids versus steroid-sparing agents. As we decrease steroids  there is a risk of recurrent myasthenic crisis. They do not want to start steroid-sparing agents.   We had a discussion about different medication strategies, possible adverse effects and strategies to manage them, expected time course of benefits in medication management which include hours to days for Mestinon, weeks to many months for prednisone, and many months to a year more for Azathioprine.  In mild to moderate generalized myasthenia gravis, Mestinon may be the only treatment needed. A cholinergic crisis, in which weakness is worsened by increased doses of the Mestinon, rarely occurs. The concomitant use of prednisone and sometimes another immunosuppressant is often required. In more severe myasthenia gravis, immunosuppression and sometimes immunomodulation should be started at the same time as Mestinon.  Discussed very slowly decreasing patient's steroids over many months. Discussed long-term steroid use which can cause significant and very problems such as diabetes, high blood pressure, osteoporosis and other conditions. Discussed immunosuppressants that can also has significant side effects as well as Mestinon.  Medications that worsen myasthenia gravis include beta blockers and calcium channel blockers, and aminoglycosides, macrolides, or fkuoroquinolones and people should avoid not polarizing neuromuscular blocking agents and are less sensitive to depolarizing neuromuscular blocking agents. Spinal or local anesthetics are generally safer options.   Need to taper steroids very slowly. Watch for symptoms of recurrence.  At some point may start mestinon, doesn't seem to need it now.  CT of chest? Will request records and see if this was completed for evaluation of thymoma. Did not receive records, will request again.  Recommendation is to taper by 5-10mg  a month until at 20mg  daily at which time can taper even slower. Tapering too fast increases risk of recurrence.   Month one of  Taper: 50mg  daily. Prednisone can be taken all at once in the morning with breakfast Month two of taper: 40mg  daily Month three of Taper: 30mg  daily - PATIENT IS HERE Month four of Taper: 25mg  daily Month five of taper: 20mg  daily and hold at which point we can reassess.   [phone] POA  Follow up 10 weeks  Cc: Dr. Forde Dandy and Dr. Casilda Carls, MD  [facility] [address] [place], [place] [address]  Phone [phone] Fax [phone]  A total of 30 minutes was spent in with this patient and her power of attorney face-to-face. Over half this time was spent on counseling patient and her power of attorney on the myasthenia gravis diagnosis and different therapeutic options available.

## 2016-02-19 NOTE — Patient Instructions (Signed)
Remember to drink plenty of fluid, eat healthy meals and do not skip any meals. Try to eat protein with a every meal and eat a healthy snack such as fruit or nuts in between meals. Try to keep a regular sleep-wake schedule and try to exercise daily, particularly in the form of walking, 20-30 minutes a day, if you can.   I would like to see you back in 10 weeks, sooner if we need to. Please call us with any interim questions, concerns, problems, updates or refill requests.   Our phone number is [phone]. We also have an after hours call service for urgent matters and there is a physician on-call for urgent questions. For any emergencies you know to call 911 or go to the nearest emergency room

## 2016-02-20 ENCOUNTER — Telehealth: Payer: Self-pay | Admitting: *Deleted

## 2016-02-20 NOTE — Telephone Encounter (Signed)
Release faxed to [facility] @ [phone] requesting records.

## 2016-02-21 ENCOUNTER — Encounter: Payer: Self-pay | Admitting: *Deleted

## 2016-02-21 NOTE — Progress Notes (Signed)
Faxed Dr Cathren Laine office note from 02/19/16 to [facility] for their records. Fax: [phone]. Received confirmation.

## 2016-03-06 ENCOUNTER — Ambulatory Visit: Payer: Medicare Other | Admitting: Neurology

## 2016-04-29 ENCOUNTER — Other Ambulatory Visit: Payer: Self-pay | Admitting: Dermatology

## 2016-04-29 ENCOUNTER — Ambulatory Visit: Payer: Medicare Other | Admitting: Neurology

## 2016-05-06 ENCOUNTER — Encounter: Payer: Self-pay | Admitting: Neurology

## 2016-05-06 ENCOUNTER — Ambulatory Visit (INDEPENDENT_AMBULATORY_CARE_PROVIDER_SITE_OTHER): Payer: Medicare Other | Admitting: Neurology

## 2016-05-06 VITALS — BP 132/82 | HR 97 | Ht 60.0 in | Wt 128.4 lb

## 2016-05-06 DIAGNOSIS — D150 Benign neoplasm of thymus: Secondary | ICD-10-CM

## 2016-05-06 DIAGNOSIS — D4989 Neoplasm of unspecified behavior of other specified sites: Secondary | ICD-10-CM

## 2016-05-06 DIAGNOSIS — G7000 Myasthenia gravis without (acute) exacerbation: Secondary | ICD-10-CM

## 2016-05-06 MED ORDER — PREDNISONE 5 MG PO TABS: ORAL_TABLET | ORAL | 11 refills | Status: DC

## 2016-05-06 NOTE — Progress Notes (Signed)
[id number]    Provider:  Dr Jaynee Walker Referring Provider: Reynold Bowen, MD Primary Care Physician:  Meredith Stack, MD  CC: Myasthenia Gravis  Interval history 05/06/2016: We continue to decrease steroids she is on 20mg  will further titrate lower, need to go slow so as not to cause an exacerbation however long-term steroids can cause significant problems and discussed again today with patient and her POA. Will decrease to 15mg  for one month then 10mg  for one month. Also need Meredith CT of the chest to evaluate for thymoma as Meredith cause of myastenia gravis.No diplopia, no ptosis, no dyspagia, no changes in voice quality, swallowing fine without choking. No new weakness, she is trying to get out and walk every day. She never had CT of the chest perfored to eval for thymoma, finally received documentation from [facility] (see below) from her myasthenia gravis exacerbation.  Interval history:  We are decreasing her steroids she is on 30mg  now for one month, next will take 25mg  for one month and then 20mg . No worsening of weakness. Still walking and doing well. No double vision. No ptosis, no difficulty swallowing, no changes in voice quality. She is here with her POA. Patient appears to suffer from significant dementia. Discussed options at this point, can start steroid-sparing agents but they decline when discussing side effects. There are significant risks with long-term steroids as well. At this point will continue to decrease steroids very slowly and try to get her down to the lowest dose possible. They are to call if symptoms worsen.  [id number] [name] Meredith 83 y.o.femalehere as Meredith referral from [name] myasthenia gravis. PMHx macular degeneration of both eyes, myasthenia gravis, hypothyroidism. Patient was in [place] at [facility]. Patient is Meredith poor historian and she says she had "to learn how to swallow food". She cannot answer questions. She says they "had to keep it out  of my lungs". She denies problems breathing, no problems swallowing. She was visiting Meredith good friend. At the end of may she drove to visit her friend [name] friend for years. She got lost going down there. It took longer than usual. The next day she went to lunch, eyelids drooped, patient gets very upset, today, irritable due to being unable to answer questions and can;t ive details.she just says she was in the hospital and was very ill. She had blood transfusions. Currently she has no swallowing problems and no vision problems. She lives in [place] in the memory unit. She has had no problems with her glucose.   Per her POA, patient left to visit Meredith friend and she was at her baseline. She lives at [place].She went to visit Meredith childhood friend and patient got lost going to [place] driving herself. She spent 5 hours to make Meredith 2-hour trip. When she got there they visited and the next day more friends came over and watched movies and 3 days into the visit they were having lunch in Meredith restaurant and acutely had difficulty swallowing with ptosis. They went to Urgent care and they examined her and sent her to the hospital and she was there for 2 days and went to [facility] to main hospital for 2 weeks. And brought back to [facility] to [employer] and went to snf for 2 weeks. She went back to her apartment and no reason not to let her have her car back. On July 10th she got very lost coming to [facility] for appt and did not make it there and POA  took her keys. She was very angry and wouldn't talk to her POA who has been Meredith close friend for many years. Brother had dementia and died recently, she has nephews not involved in her care. Meredith few weeks later she was wandering at 5am and she said she wanted to see the security guard because he would tell her about the truth about how long she was hospitalized.   Reviewed notes, labs and imaging from outside physicians, which showed:  limited records show that patient presented to  an outside hospital on May 25 with complaints of difficulty swallowing and slurred speech. Patient was seen several days prior and thought to have angioedema discharge home. Patient symptoms did not resolve and she returned to the ED and admitted. Patient was noted to have profound dysphasia and difficulty swallowing. Due to concern for myasthenia gravis she was started on high-dose steroids and IV Mestinon with no improvement in symptoms. She was transferred to the main for further neurologic evaluation. Neurology was consulted and PLEX was initiated for Meredith total of 5-7 treatments. Denies any antibodies returned elevated. She was intubated for airway protection. She completed 5 plasma exchange treatments. Her hypernatremia and hypokalemia resolved. This is in the setting with klebsiella tracheobronchitis. She has Meredith past medical history of hypothyroidism. She was discharged on prednisone 20 mg 3 times Meredith day. MRI of the brain was unremarkable. Patient worked with physical therapy and occupational therapy and improved.   Review of Systems: Patient complains of symptoms per HPI as well as the following symptoms: easy bruising, swelling in the legs, runny nose. Pertinent negatives per HPI. All others negative.   Social History   Social History  . Marital status: Single    Spouse name: N/Meredith  . Number of children: 0  . Years of education: BA   Occupational History  . Retired    Social History Main Topics  . Smoking status: Never Smoker  . Smokeless tobacco: Never Used  . Alcohol use No  . Drug use: No  . Sexual activity: Not Currently   Other Topics Concern  . Not on file   Social History Narrative   Lives at [place]   Phone: [phone]   Fax: [phone]      Uses pharmacy at [facility]:    Pharmacy number: [phone]   Fax: [phone]      Caffeine use: No soda   Hot tea ocass   Coffee-decaf             Family History  Problem Relation  Age of Onset  . Cancer Mother   . Heart attack Father   . Prostate cancer Brother     Past Medical History:  Diagnosis Date  . Arthritis   . Diverticulosis of colon   . GERD (gastroesophageal reflux disease)   . H/O hiatal hernia   . Hemorrhoid   . Macular degeneration   . Osteopenia   . Seizures ([place])    X1 AT AGE 89  - NONE SINCE  . Thyroid disease     Past Surgical History:  Procedure Laterality Date  . BREAST SURGERY     BREAST BX   . CARPAL TUNNEL RELEASE    . CHOLECYSTECTOMY  04/30/2012   Procedure: LAPAROSCOPIC CHOLECYSTECTOMY WITH INTRAOPERATIVE CHOLANGIOGRAM;  Surgeon: Earnstine Regal, MD;  Location: [facility];  Service: General;  Laterality: N/Meredith;  . COLONOSCOPY    . EYE SURGERY     CATARACTS  .  FEMUR FRACTURE SURGERY    . FOOT SURGERY    . JOINT REPLACEMENT  1990 / 2011   BIL KNEE REPLACEDMENTS  . LASER ABLATION     FOR PHLEBITIS    Current Outpatient Prescriptions  Medication Sig Dispense Refill  . alendronate (FOSAMAX) 70 MG tablet Take 70 mg by mouth once Meredith week. Take with Meredith full glass of water on an empty stomach.    . busPIRone (BUSPAR) 5 MG tablet Take 5 mg by mouth 3 (three) times daily.    . cycloSPORINE (RESTASIS) 0.05 % ophthalmic emulsion Place 1 drop into both eyes 2 (two) times daily.    . furosemide (LASIX) 20 MG tablet Take 20 mg by mouth daily.    Marland Kitchen levothyroxine (SYNTHROID, LEVOTHROID) 75 MCG tablet Take 75 mcg by mouth every morning.    Marland Kitchen LORazepam (ATIVAN) 0.5 MG tablet Take 0.5 mg by mouth as needed for anxiety.    . Multiple Vitamins-Minerals (PRESERVISION AREDS 2) CAPS Take 1 capsule by mouth 2 (two) times daily.    . predniSONE (DELTASONE) 10 MG tablet Take 10 mg by mouth 2 (two) times daily. Takes 10mg  with 5mg  tablet= 15mg  po BID     . Probiotic Product (ALIGN) 4 MG CAPS Take 1 capsule by mouth daily.     Marland Kitchen pyridOXINE (VITAMIN B-6) 100 MG tablet Take 100 mg by mouth 2 (two) times daily.     . Vitamin D, Ergocalciferol, (DRISDOL) 50000  UNITS CAPS Take 50,000 Units by mouth every 7 (seven) days. Takes on sundays     No current facility-administered medications for this visit.     Allergies as of 05/06/2016 - Review Complete 05/06/2016  Allergen Reaction Noted  . Ivp dye [iodinated diagnostic agents] Shortness Of Breath 04/14/2012  . Red dye  04/14/2012    Vitals: BP 132/82   Pulse 97   Ht 5' (1.524 m)   Wt 128 lb 6.4 oz (58.2 kg)   BMI 25.08 kg/m  Last Weight:  Wt Readings from Last 1 Encounters:  05/06/16 128 lb 6.4 oz (58.2 kg)   Last Height:   Ht Readings from Last 1 Encounters:  05/06/16 5' (1.524 m)       Speech: Speech is normal; fluent and spontaneous with normal comprehension.  Cognition:  The patient is oriented to person recent and remote memory impaired;  language fluent;  Impaired attention, concentration,  fund of knowledge impaired Cranial Nerves: The pupils are equal, round, and reactive to light. Visual fields are full to finger confrontation. Impaired upgaze with impaired smooth pursuit.Trigeminal sensation is intact and the muscles of mastication are normal. The face is symmetric. The palate elevates in the midline. Hearing intact. Voice is normal. Shoulder shrug is normal. The tongue has normal motion without fasciculations.   Coordination: No dysmetria  Gait: Uses [name], mildly stooped, slow and short strides  Motor Observation: No asymmetry, no atrophy, and no involuntary movements noted. Tone: Normal muscle tone.   Posture:  Mildly stooped  Strength: difficulty due to dementia but appears to have mild proximal weaknesswhich is stable  Sensation: intact to LT   Assessment/Plan:83 year old female here for follow-up after myasthenic crisis. She was discharged on high-dose 60 mg of prednisone and is now on 20mg  of her taper. Patient is in Meredith memory unit and there appears to be significant dementia. She is here with her  healthcare power of attorney and I had Meredith long discussion regarding myasthenia gravis treatment again.We will continue to taper  until we get to 5mg  Meredith day and then hold. She will likely need life-long prednisone (they decline steroid-sparing agents) and would like to get her on the lowest dose possible.   Recommendation is to taper Prednisone by 5-10mg  Meredith month.. Tapering too fast increases risk of recurrence. Decrease steroids to 15mg  daily for one month then decrease to 10mg  daily and hold until next appointment.    Had Meredith lengthy discussion about her myasthenia gravis. Discussed benefits versus risks of long-term steroids versus steroid-sparing agents. As we decrease steroids there is Meredith risk of recurrent myastheniccrisis. They do not want to start steroid-sparing agents.This is still the case today and we will continue to decrease to lowest dose possible however she will likely need to be on life-long steroids. We may be able to get her to 5mg  or 2.5mg  Meredith day.  Again discussed very slowly decreasing patient's steroids over many months. Discussed long-term steroid use which can cause significant and very problems such as diabetes, high blood pressure, osteoporosis and other conditions. Discussed immunosuppressants that can also has significant side effects and they do not want any steroid-sparing medications  Medications that worsen myasthenia gravis include beta blockers and calcium channel blockers, and aminoglycosides, macrolides, or fkuoroquinolones and people should avoid not polarizing neuromuscular blocking agents and are less sensitive to depolarizing neuromuscular blocking agents. Spinal or local anesthetics are generally safer options. Provided them Meredith list.   Need to taper steroids very slowly. Watch for symptoms of recurrence.  At some point may start mestinon, doesn't seem to need it now. She also has diarrhea chronically and  don't want to risk worsening it.  CT of chest needed for  evaluation of thymoma. Ordered.  Meds ordered this encounter  Medications  . predniSONE (DELTASONE) 5 MG tablet    Sig: Decreaase to 15mg  daily for one month and then decrease to 10mg  daily and hold.    Dispense:  90 tablet    Refill:  11    Orders Placed This Encounter  Procedures  . CT CHEST W CONTRAST    Standing Status:   Future    Standing Expiration Date:   11/03/2017    Order Specific Question:   If indicated for the ordered procedure, I authorize the administration of contrast media per Radiology protocol    Answer:   Yes    Order Specific Question:   Reason for Exam (SYMPTOM  OR DIAGNOSIS REQUIRED)    Answer:   Evaluate for thymoma in this patient with myasthenia gravis    Order Specific Question:   Preferred imaging location?    Answer:   [facility]     [phone] POA  Follow up 3 months  Cc: Meredith. Forde Dandy and Meredith. Casilda Carls, [facility] [address] [place], [place] [address]  Phone [phone] Fax [phone]  Meredith total of 25  minutes was spent in with this patient and her power of attorney face-to-face. Over half this time was spent on counseling patient and her power of attorney on the myasthenia gravis diagnosis and different therapeutic options available.

## 2016-05-06 NOTE — Patient Instructions (Addendum)
Remember to drink plenty of fluid, eat healthy meals and do not skip any meals. Try to eat protein with a every meal and eat a healthy snack such as fruit or nuts in between meals. Try to keep a regular sleep-wake schedule and try to exercise daily, particularly in the form of walking, 20-30 minutes a day, if you can.   As far as your medications are concerned, I would like to suggest; Decrease Prednisone to 15mg  daily for one month then decrease to 10mg  daily  As far as diagnostic testing: CT of chest   I would like to see you back in 3 months, sooner if we need to. Please call us with any interim questions, concerns, problems, updates or refill requests.   Our phone number is [phone]. We also have an after hours call service for urgent matters and there is a physician on-call for urgent questions. For any emergencies you know to call 911 or go to the nearest emergency room

## 2016-05-07 ENCOUNTER — Telehealth: Payer: Self-pay | Admitting: Neurology

## 2016-05-07 ENCOUNTER — Telehealth: Payer: Self-pay

## 2016-05-07 NOTE — Telephone Encounter (Signed)
[name] [phone] called needs clarification on directions for predniSONE (DELTASONE) 5 MG tablet . Please call

## 2016-05-07 NOTE — Telephone Encounter (Signed)
[name] notes faxed to [facility] # [phone].

## 2016-05-08 NOTE — Telephone Encounter (Signed)
Called [facility] and reviewed decrease of Prednisone to 15mg  daily for one month then decrease to 10mg  daily. Verbalized understanding and appreciation for call.

## 2016-05-20 ENCOUNTER — Ambulatory Visit
Admission: RE | Admit: 2016-05-20 | Discharge: 2016-05-20 | Disposition: A | Discharge status: Home / Self Care | Payer: Medicare Other | Source: Ambulatory Visit | Attending: Neurology | Admitting: Neurology

## 2016-05-20 ENCOUNTER — Other Ambulatory Visit: Payer: Self-pay

## 2016-05-20 DIAGNOSIS — G7000 Myasthenia gravis without (acute) exacerbation: Secondary | ICD-10-CM

## 2016-05-20 DIAGNOSIS — D150 Benign neoplasm of thymus: Secondary | ICD-10-CM

## 2016-05-20 DIAGNOSIS — D4989 Neoplasm of unspecified behavior of other specified sites: Secondary | ICD-10-CM

## 2016-05-20 NOTE — Progress Notes (Signed)
Pt allergic to contrast. Discussed w/ Dr. Jaynee Eagles an new CT order placed w/o contrast.

## 2016-05-23 ENCOUNTER — Telehealth: Payer: Self-pay

## 2016-05-23 NOTE — Telephone Encounter (Signed)
Called and spoke to caregiver at [facility]. Reviewed negative results. Also faxed to [facility] [phone] as requested.

## 2016-05-23 NOTE — Telephone Encounter (Signed)
-----   Message from Melvenia Beam, MD sent at 05/20/2016  6:44 PM EST ----- Thymoma negative

## 2016-07-08 ENCOUNTER — Other Ambulatory Visit: Payer: Self-pay | Admitting: Dermatology

## 2016-08-06 ENCOUNTER — Encounter: Payer: Self-pay | Admitting: Neurology

## 2016-08-06 ENCOUNTER — Ambulatory Visit (INDEPENDENT_AMBULATORY_CARE_PROVIDER_SITE_OTHER): Payer: Medicare Other | Admitting: Neurology

## 2016-08-06 ENCOUNTER — Telehealth: Payer: Self-pay | Admitting: Neurology

## 2016-08-06 ENCOUNTER — Encounter (INDEPENDENT_AMBULATORY_CARE_PROVIDER_SITE_OTHER): Payer: Self-pay

## 2016-08-06 VITALS — BP 136/77 | HR 81 | Ht 60.0 in | Wt 133.0 lb

## 2016-08-06 DIAGNOSIS — G7000 Myasthenia gravis without (acute) exacerbation: Secondary | ICD-10-CM

## 2016-08-06 MED ORDER — PREDNISONE 2.5 MG PO TABS: ORAL_TABLET | ORAL | 5 refills | Status: DC

## 2016-08-06 NOTE — Progress Notes (Signed)
[facility]    Provider:  Dr Jaynee Eagles Referring Provider: Reynold Bowen, MD Primary Care Physician:  Lajean Manes, MD  CC: Myasthenia Gravis  Interval history 08/06/2016: Patient's been doing fine on 10 mg of prednisone daily. We'll decrease to 7.5 mg for 1 month and then to 5 mg and continue on 5 mg daily. If patient has any symptom recurrence including ptosis, diplopia, shortness of breath, weakness, nasal quality of voice or any other symptoms please go back to 10 mg daily and contact our office.  Interval history 05/06/2016: We continue to decrease steroids she is on 20mg  will further titrate lower, need to go slow so as not to cause an exacerbation however long-term steroids can cause significant problems and discussed again today with patient and her POA. Will decrease to 15mg  for one month then 10mg  for one month. Also need a CT of the chest to evaluate for thymoma as a cause of myastenia gravis.No diplopia, no ptosis, no dyspagia, no changes in voice quality, swallowing fine without choking. No new weakness, she is trying to get out and walk every day. She never had CT of the chest perfored to eval for thymoma, finally received documentation from [facility] (see below) from her myasthenia gravis exacerbation.  Interval history: We are decreasing her steroids she is on 30mg  now for one month, next will take 25mg  for one month and then 20mg .No worsening of weakness. Still walking and doing well. No double vision. No ptosis, no difficulty swallowing, no changes in voice quality. She is here with her POA. Patient appears to suffer from significant dementia. Discussed options at this point, can start steroid-sparing agents but they decline when discussing side effects. There are significant risks with long-term steroids as well. At this point will continue to decrease steroids very slowly and try to get her down to the lowest dose possible. They are to call if symptoms  worsen.  [id number] A Raeis a 83 y.o.femalehere as a referral from Dr. Evlyn Courier myasthenia gravis. PMHx macular degeneration of both eyes, myasthenia gravis, hypothyroidism. Patient was in [place] at [facility]. Patient is a poor historian and she says she had "to learn how to swallow food". She cannot answer questions. She says they "had to keep it out of my lungs". She denies problems breathing, no problems swallowing. She was visiting a good friend. At the end of may she drove to visit her friend Lelon Frohlich friend for years. She got lost going down there. It took longer than usual. The next day she went to lunch, eyelids drooped, patient gets very upset, today, irritable due to being unable to answer questions and can;t ive details.she just says she was in the hospital and was very ill. She had blood transfusions. Currently she has no swallowing problems and no vision problems. She lives in [place] in the memory unit. She has had no problems with her glucose.   Per her POA, patient left to visit a friend and she was at her baseline. She lives at [place].She went to visit a childhood friend and patient got lost going to [place] driving herself. She spent 5 hours to make a 2-hour trip. When she got there they visited and the next day more friends came over and watched movies and 3 days into the visit they were having lunch in a restaurant and acutely had difficulty swallowing with ptosis. They went to Urgent care and they examined her and sent her to the hospital and she was there for 2  days and went to [place] to main hospital for 2 weeks. And brought back to [facility] to [employer] and went to snf for 2 weeks. She went back to her apartment and no reason not to let her have her car back. On July 10th she got very lost coming to [facility] for appt and did not make it there and POA took her keys. She was very angry and wouldn't talk to her POA who has been a close friend for many years. Brother  had dementia and died recently, she has nephews not involved in her care. A few weeks later she was wandering at 5am and she said she wanted to see the security guard because he would tell her about the truth about how long she was hospitalized.   Reviewed notes, labs and imaging from outside physicians, which showed:  limited records show that patient presented to an outside hospital on May 25 with complaints of difficulty swallowing and slurred speech. Patient was seen several days prior and thought to have angioedema discharge home. Patient symptoms did not resolve and she returned to the ED and admitted. Patient was noted to have profound dysphasia and difficulty swallowing. Due to concern for myasthenia gravis she was started on high-dose steroids and IV Mestinon with no improvement in symptoms. She was transferred to the main for further neurologic evaluation. Neurology was consulted and PLEX was initiated for a total of 5-7 treatments. Denies any antibodies returned elevated. She was intubated for airway protection. She completed 5 plasma exchange treatments. Her hypernatremia and hypokalemia resolved. This is in the setting with klebsiella tracheobronchitis. She has a past medical history of hypothyroidism. She was discharged on prednisone 20 mg 3 times a day. MRI of the brain was unremarkable. Patient worked with physical therapy and occupational therapy and improved.   Review of Systems: Patient complains of symptoms per HPI as well as the following symptoms: easy bruising, swelling in the legs, runny nose. Pertinent negatives per HPI. All others negative.   Social History   Social History  . Marital status: Single    Spouse name: N/A  . Number of children: 0  . Years of education: BA   Occupational History  . Retired    Social History Main Topics  . Smoking status: Never Smoker  . Smokeless tobacco: Never Used  . Alcohol use No  . Drug use: No  . Sexual activity: Not  Currently   Other Topics Concern  . Not on file   Social History Narrative   Lives at [place]   Phone: [phone]   Fax: [phone]      Uses pharmacy at [facility]:    Pharmacy number: [phone]   Fax: [phone]      Caffeine use: No soda   Hot tea ocass   Coffee-decaf             Family History  Problem Relation Age of Onset  . Cancer Mother   . Heart attack Father   . Prostate cancer Brother     Past Medical History:  Diagnosis Date  . Arthritis   . Diverticulosis of colon   . GERD (gastroesophageal reflux disease)   . H/O hiatal hernia   . Hemorrhoid   . Macular degeneration   . Osteopenia   . Seizures ([place])    X1 AT AGE 74  - NONE SINCE  . Thyroid disease     Past Surgical History:  Procedure  Laterality Date  . BREAST SURGERY     BREAST BX   . CARPAL TUNNEL RELEASE    . CHOLECYSTECTOMY  04/30/2012   Procedure: LAPAROSCOPIC CHOLECYSTECTOMY WITH INTRAOPERATIVE CHOLANGIOGRAM;  Surgeon: Earnstine Regal, MD;  Location: [facility];  Service: General;  Laterality: N/A;  . COLONOSCOPY    . EYE SURGERY     CATARACTS  . FEMUR FRACTURE SURGERY    . FOOT SURGERY    . JOINT REPLACEMENT  1990 / 2011   BIL KNEE REPLACEDMENTS  . LASER ABLATION     FOR PHLEBITIS    Current Outpatient Prescriptions  Medication Sig Dispense Refill  . alendronate (FOSAMAX) 70 MG tablet Take 70 mg by mouth once a week. Take with a full glass of water on an empty stomach.    . busPIRone (BUSPAR) 5 MG tablet Take 5 mg by mouth 3 (three) times daily.    . cycloSPORINE (RESTASIS) 0.05 % ophthalmic emulsion Place 1 drop into both eyes 2 (two) times daily.    . furosemide (LASIX) 20 MG tablet Take 20 mg by mouth daily.    Marland Kitchen levothyroxine (SYNTHROID, LEVOTHROID) 75 MCG tablet Take 75 mcg by mouth every morning.    Marland Kitchen LORazepam (ATIVAN) 0.5 MG tablet Take 0.5 mg by mouth as needed for anxiety.    . Multiple Vitamins-Minerals (PRESERVISION AREDS 2) CAPS Take 1  capsule by mouth 2 (two) times daily.    . predniSONE (DELTASONE) 5 MG tablet Decreaase to 15mg  for one month and then decrease to 10mg  and hold. 90 tablet 11  . Probiotic Product (ALIGN) 4 MG CAPS Take 1 capsule by mouth daily.     Marland Kitchen pyridOXINE (VITAMIN B-6) 100 MG tablet Take 100 mg by mouth 2 (two) times daily.     . Vitamin D, Ergocalciferol, (DRISDOL) 50000 UNITS CAPS Take 50,000 Units by mouth every 7 (seven) days. Takes on sundays     No current facility-administered medications for this visit.     Allergies as of 08/06/2016 - Review Complete 08/06/2016  Allergen Reaction Noted  . Ivp dye [iodinated diagnostic agents] Shortness Of Breath 04/14/2012  . Metrizamide Shortness Of Breath 04/14/2012  . Red dye  04/14/2012    Vitals: BP 136/77   Pulse 81   Ht 5' (1.524 m)   Wt 133 lb (60.3 kg)   BMI 25.97 kg/m  Last Weight:  Wt Readings from Last 1 Encounters:  08/06/16 133 lb (60.3 kg)   Last Height:   Ht Readings from Last 1 Encounters:  08/06/16 5' (1.524 m)        Speech: Speech is normal; fluent and spontaneous with impaired comprehension.  Cognition:  The patient is oriented to person recent and remote memory impaired;  language fluent;  Impaired attention, concentration,  fund of knowledge impaired Cranial Nerves: The pupils are equal, round, and reactive to light. Visual fields are full to finger confrontation. Impaired upgaze with impaired smooth pursuit.Trigeminal sensation is intact and the muscles of mastication are normal. The face is symmetric. No ptosis.  The palate elevates in the midline. Hearing intact. Voice is normal. Shoulder shrug is normal. The tongue has normal motion without fasciculations.   Coordination: No dysmetria  Gait: Uses a walker, mildly stooped, slow and short strides  Motor Observation: No asymmetry, no atrophy, and no involuntary movements noted. Tone: Normal muscle tone.   Posture:   Mildly stooped  Strength: difficulty due to dementia but appears to have mild proximal weaknesswhich is stable  Sensation: intact to LT   Assessment/Plan:83 year old female here for follow-up after myasthenic crisis. She was discharged on high-dose 60 mg of prednisone and is now on 10mg  of her taper. Patient is in a memory unit and there appears to be significant dementia. She is here with her healthcare power of attorney and I had a long discussion regarding myasthenia gravis treatment again.We will continue to taper until we get to 5mg  a day and then hold. She will likely need life-long prednisone (they decline steroid-sparing agents) and would like to get her on the lowest dose possible.   Recommendation is to taper Prednisone slowly.. Tapering too fast increases risk of recurrence. Decrease steroids to 7.5mg  daily for one month then decrease to 5mg  daily and hold until next appointment.    Had a lengthy discussion about her myasthenia gravis. Discussed benefits versus risks of long-term steroids versus steroid-sparing agents. As we decrease steroids there is a risk of recurrent myastheniccrisis. They do not want to start steroid-sparing agents.This is still the case today and we will continue to decrease to lowest dose possible however she will likely need to be on life-long steroids. We may be able to get her to 5mg  or 2.5mg  a day.  Again discussed very slowly decreasing patient's steroids over many months. Discussed long-term steroid use which can cause significant and very problems such as diabetes, high blood pressure, osteoporosis and other conditions. Discussed immunosuppressants that canalso has significant side effects and they do not want any steroid-sparing medications  Medications that worsen myasthenia gravis include beta blockers and calcium channel blockers, and aminoglycosides, macrolides, or fkuoroquinolones and people should avoid not polarizing neuromuscular  blocking agents and are less sensitive to depolarizing neuromuscular blocking agents. Spinal or local anesthetics are generally safer options. Provided them a list.   Need to taper steroids very slowly. Watch for symptoms of recurrence.  At some point may start mestinon, doesn't seem to need it now. She also has diarrhea chronically and  don't want to risk worsening it.  CT of chest needed for evaluation of thymoma. Negative . Sarina Ill, MD  [facility] [address] [place], [place] [address]  Phone [phone] Fax [phone]  A total of 15 minutes was spent face-to-face with this patient. Over half this time was spent on counseling patient on the myasthenia gravis diagnosis and different diagnostic and therapeutic options available.

## 2016-08-06 NOTE — Telephone Encounter (Signed)
Delsa Sale, can you fax a copy of the note to the nursing home and ensure it is received? I am tapering her steroids further and want to ensure that is communicated. I have already forwarded to Dr. Forde Dandy and Dr. Felipa Eth. thanks

## 2016-08-06 NOTE — Patient Instructions (Addendum)
Remember to drink plenty of fluid, eat healthy meals and do not skip any meals. Try to eat protein with a every meal and eat a healthy snack such as fruit or nuts in between meals. Try to keep a regular sleep-wake schedule and try to exercise daily, particularly in the form of walking, 20-30 minutes a day, if you can.   As far as your medications are concerned, I would like to suggest: Decrease steroids to 7.5mg  daily for one month and then 5mg  daily with breakfast. If any symptoms occur go back to 10mg  daily and please inform our office.   I would like to see you back in 6 months, sooner if we need to. Please call us with any interim questions, concerns, problems, updates or refill requests.    Our phone number is [phone]. We also have an after hours call service for urgent matters and there is a physician on-call for urgent questions. For any emergencies you know to call 911 or go to the nearest emergency room  Myasthenia Gravis Myasthenia gravis (MG) means severe weakness. It is a long-term (chronic) condition that causes weakness in the muscles you can control (voluntary muscles). MG can affect any voluntary muscle. The muscles most often affected are the ones that control:  Eye movement.  Facial movements.  Swallowing. MG is an autoimmune disease, which means that your body's defense system (immune system) attacks healthy parts of your body instead of germs and other things that make you sick. When you have MG, your immune system makes proteins (antibodies) that block the chemical (acetylcholine) your body needs to send nerve signals to your muscles. This causes muscle weakness. What are the causes? The exact cause of MG is unknown. One possible cause is an enlarged thymus gland, which is located under your breastbone. What are the signs or symptoms? The earliest symptom of MG is muscle weakness that gets worse with activity and gets better after rest. Other symptoms of MG may  include:  Drooping eyelids.  Double vision.  Loss of facial expression.  Trouble chewing and swallowing.  Slurred speech.  A waddling walk.  Weakness of the arms, hands, and legs. Trouble breathing is the most dangerous symptom of MG. Sudden and severe difficulty breathing (myasthenic crisis) may require emergency breathing support. This symptom sometimes happens after:  Infection.  Fever.  Drug reaction. How is this diagnosed? It can be hard to diagnose MG because muscle weakness is a common symptom in many conditions. Your health care provider will do a physical exam. You may also have tests that will help make a diagnosis. These may include:  A blood test.  A test using the medicine edrophonium. This medicine increases muscle strength by slowing the breakdown of acetylcholine.  Tests to measure nerve conduction to muscle (electromyography).  An imaging study of the chest (CT or MRI). How is this treated? Treatment can improve muscle strength. Sometimes symptoms of MG go away for a while (remission) and you can stop treatment. Possible treatments include:  Medicine.  Removal of the thymus gland (thymectomy). This may result in a long remission for some people. Follow these instructions at home:  Take medicines only as directed by your health care provider.  Get plenty of rest to conserve your energy.  Take frequent breaks to rest your eyes.  Maintain a healthy diet and a healthy weight.  Do not use any tobacco products including cigarettes, chewing tobacco, or electronic cigarettes. If you need help quitting, ask your health  care provider.  Keep all follow-up visits as directed by your health care provider. This is important. Contact a health care provider if:  Your symptoms get worse after a fever or infection.  You have a reaction to a medicine you are taking.  Your symptoms change or get worse. Get help right away if: You have trouble breathing. This  information is not intended to replace advice given to you by your health care provider. Make sure you discuss any questions you have with your health care provider. Document Released: 06/17/2000 Document Revised: 08/17/2015 Document Reviewed: 05/12/2013 Elsevier Interactive Patient Education  2017 [employer].

## 2016-08-07 NOTE — Telephone Encounter (Signed)
OV notes from yesterday faxed to [employer] (Lena) w/ instructions on decreasing prednisone. Phone: [phone] Fax: [phone]

## 2016-10-29 ENCOUNTER — Other Ambulatory Visit: Payer: Self-pay

## 2016-10-29 ENCOUNTER — Telehealth: Payer: Self-pay | Admitting: Neurology

## 2016-10-29 ENCOUNTER — Emergency Department (HOSPITAL_COMMUNITY): Payer: Medicare Other

## 2016-10-29 ENCOUNTER — Inpatient Hospital Stay (HOSPITAL_COMMUNITY)
Admission: EM | Admit: 2016-10-29 | Discharge: 2016-11-13 | DRG: 870 | Disposition: A | Discharge status: Hospice | Payer: Medicare Other | Attending: Pulmonary Disease | Admitting: Pulmonary Disease

## 2016-10-29 DIAGNOSIS — I361 Nonrheumatic tricuspid (valve) insufficiency: Secondary | ICD-10-CM | POA: Diagnosis not present

## 2016-10-29 DIAGNOSIS — Z8042 Family history of malignant neoplasm of prostate: Secondary | ICD-10-CM | POA: Diagnosis not present

## 2016-10-29 DIAGNOSIS — Z515 Encounter for palliative care: Secondary | ICD-10-CM

## 2016-10-29 DIAGNOSIS — R402232 Coma scale, best verbal response, inappropriate words, at arrival to emergency department: Secondary | ICD-10-CM | POA: Diagnosis present

## 2016-10-29 DIAGNOSIS — G40909 Epilepsy, unspecified, not intractable, without status epilepticus: Secondary | ICD-10-CM | POA: Diagnosis present

## 2016-10-29 DIAGNOSIS — J811 Chronic pulmonary edema: Secondary | ICD-10-CM | POA: Diagnosis not present

## 2016-10-29 DIAGNOSIS — J9601 Acute respiratory failure with hypoxia: Secondary | ICD-10-CM

## 2016-10-29 DIAGNOSIS — Z9049 Acquired absence of other specified parts of digestive tract: Secondary | ICD-10-CM | POA: Diagnosis not present

## 2016-10-29 DIAGNOSIS — J690 Pneumonitis due to inhalation of food and vomit: Secondary | ICD-10-CM | POA: Diagnosis present

## 2016-10-29 DIAGNOSIS — J9602 Acute respiratory failure with hypercapnia: Secondary | ICD-10-CM | POA: Diagnosis present

## 2016-10-29 DIAGNOSIS — F0390 Unspecified dementia without behavioral disturbance: Secondary | ICD-10-CM | POA: Diagnosis present

## 2016-10-29 DIAGNOSIS — E876 Hypokalemia: Secondary | ICD-10-CM | POA: Diagnosis not present

## 2016-10-29 DIAGNOSIS — A419 Sepsis, unspecified organism: Principal | ICD-10-CM | POA: Diagnosis present

## 2016-10-29 DIAGNOSIS — J189 Pneumonia, unspecified organism: Secondary | ICD-10-CM | POA: Diagnosis present

## 2016-10-29 DIAGNOSIS — G7001 Myasthenia gravis with (acute) exacerbation: Secondary | ICD-10-CM | POA: Diagnosis present

## 2016-10-29 DIAGNOSIS — Z91041 Radiographic dye allergy status: Secondary | ICD-10-CM | POA: Diagnosis not present

## 2016-10-29 DIAGNOSIS — Z452 Encounter for adjustment and management of vascular access device: Secondary | ICD-10-CM

## 2016-10-29 DIAGNOSIS — R0609 Other forms of dyspnea: Secondary | ICD-10-CM | POA: Diagnosis not present

## 2016-10-29 DIAGNOSIS — Z96653 Presence of artificial knee joint, bilateral: Secondary | ICD-10-CM | POA: Diagnosis present

## 2016-10-29 DIAGNOSIS — G7000 Myasthenia gravis without (acute) exacerbation: Secondary | ICD-10-CM | POA: Diagnosis not present

## 2016-10-29 DIAGNOSIS — Z9841 Cataract extraction status, right eye: Secondary | ICD-10-CM

## 2016-10-29 DIAGNOSIS — E039 Hypothyroidism, unspecified: Secondary | ICD-10-CM | POA: Diagnosis present

## 2016-10-29 DIAGNOSIS — R402352 Coma scale, best motor response, localizes pain, at arrival to emergency department: Secondary | ICD-10-CM | POA: Diagnosis present

## 2016-10-29 DIAGNOSIS — Z66 Do not resuscitate: Secondary | ICD-10-CM | POA: Diagnosis not present

## 2016-10-29 DIAGNOSIS — R1310 Dysphagia, unspecified: Secondary | ICD-10-CM | POA: Diagnosis present

## 2016-10-29 DIAGNOSIS — K117 Disturbances of salivary secretion: Secondary | ICD-10-CM | POA: Diagnosis not present

## 2016-10-29 DIAGNOSIS — Z8781 Personal history of (healed) traumatic fracture: Secondary | ICD-10-CM

## 2016-10-29 DIAGNOSIS — Z978 Presence of other specified devices: Secondary | ICD-10-CM

## 2016-10-29 DIAGNOSIS — R739 Hyperglycemia, unspecified: Secondary | ICD-10-CM | POA: Diagnosis present

## 2016-10-29 DIAGNOSIS — R0600 Dyspnea, unspecified: Secondary | ICD-10-CM

## 2016-10-29 DIAGNOSIS — J9690 Respiratory failure, unspecified, unspecified whether with hypoxia or hypercapnia: Secondary | ICD-10-CM

## 2016-10-29 DIAGNOSIS — K219 Gastro-esophageal reflux disease without esophagitis: Secondary | ICD-10-CM | POA: Diagnosis present

## 2016-10-29 DIAGNOSIS — E870 Hyperosmolality and hypernatremia: Secondary | ICD-10-CM | POA: Diagnosis not present

## 2016-10-29 DIAGNOSIS — Z8249 Family history of ischemic heart disease and other diseases of the circulatory system: Secondary | ICD-10-CM

## 2016-10-29 DIAGNOSIS — Z7952 Long term (current) use of systemic steroids: Secondary | ICD-10-CM | POA: Diagnosis not present

## 2016-10-29 DIAGNOSIS — I248 Other forms of acute ischemic heart disease: Secondary | ICD-10-CM | POA: Diagnosis not present

## 2016-10-29 DIAGNOSIS — Z9842 Cataract extraction status, left eye: Secondary | ICD-10-CM

## 2016-10-29 DIAGNOSIS — J9600 Acute respiratory failure, unspecified whether with hypoxia or hypercapnia: Secondary | ICD-10-CM

## 2016-10-29 DIAGNOSIS — R402122 Coma scale, eyes open, to pain, at arrival to emergency department: Secondary | ICD-10-CM | POA: Diagnosis present

## 2016-10-29 LAB — URINALYSIS, ROUTINE W REFLEX MICROSCOPIC
Bilirubin Urine: NEGATIVE
GLUCOSE, UA: 50 mg/dL — AB
Hgb urine dipstick: NEGATIVE
Ketones, ur: 5 mg/dL — AB
LEUKOCYTES UA: NEGATIVE
NITRITE: NEGATIVE
PH: 6 (ref 5.0–8.0)
Protein, ur: 100 mg/dL — AB
Specific Gravity, Urine: 1.012 (ref 1.005–1.030)

## 2016-10-29 LAB — CREATININE, SERUM
Creatinine, Ser: 0.74 mg/dL (ref 0.44–1.00)
GFR calc Af Amer: >60 mL/min (ref >60)
GFR calc non Af Amer: >60 mL/min (ref >60)

## 2016-10-29 LAB — BASIC METABOLIC PANEL
ANION GAP: 12 (ref 5–15)
BUN: 15 mg/dL (ref 6–20)
CALCIUM: 9.2 mg/dL (ref 8.9–10.3)
CHLORIDE: 107 mmol/L (ref 101–111)
CO2: 22 mmol/L (ref 22–32)
Creatinine, Ser: 0.72 mg/dL (ref 0.44–1.00)
GFR calc Af Amer: >60 mL/min (ref >60)
GFR calc non Af Amer: >60 mL/min (ref >60)
GLUCOSE: 193 mg/dL — AB (ref 65–99)
Potassium: 3.8 mmol/L (ref 3.5–5.1)
Sodium: 141 mmol/L (ref 135–145)

## 2016-10-29 LAB — PHOSPHORUS: Phosphorus: 2.8 mg/dL (ref 2.5–4.6)

## 2016-10-29 LAB — I-STAT ARTERIAL BLOOD GAS, ED
Acid-base deficit: 1 mmol/L (ref 0.0–2.0)
BICARBONATE: 26 mmol/L (ref 20.0–28.0)
O2 Saturation: 91 %
PCO2 ART: 48.3 mmHg — AB (ref 32.0–48.0)
PO2 ART: 61 mmHg — AB (ref 83.0–108.0)
Patient temperature: 96
TCO2: 28 mmol/L (ref 0–100)
pH, Arterial: 7.332 — ABNORMAL LOW (ref 7.350–7.450)

## 2016-10-29 LAB — CBC WITH DIFFERENTIAL/PLATELET
BASOS ABS: 0 10*3/uL (ref 0.0–0.1)
Basophils Relative: 0 %
Eosinophils Absolute: 0.1 10*3/uL (ref 0.0–0.7)
Eosinophils Relative: 1 %
HCT: 47.4 % — ABNORMAL HIGH (ref 36.0–46.0)
HEMOGLOBIN: 15 g/dL (ref 12.0–15.0)
LYMPHS PCT: 37 %
Lymphs Abs: 5 10*3/uL — ABNORMAL HIGH (ref 0.7–4.0)
MCH: 30.9 pg (ref 26.0–34.0)
MCHC: 31.6 g/dL (ref 30.0–36.0)
MCV: 97.7 fL (ref 78.0–100.0)
MONO ABS: 0.8 10*3/uL (ref 0.1–1.0)
MONOS PCT: 6 %
NEUTROS ABS: 7.8 10*3/uL — AB (ref 1.7–7.7)
NEUTROS PCT: 56 %
Platelets: 258 10*3/uL (ref 150–400)
RBC: 4.85 MIL/uL (ref 3.87–5.11)
RDW: 14.4 % (ref 11.5–15.5)
WBC: 13.7 10*3/uL — ABNORMAL HIGH (ref 4.0–10.5)

## 2016-10-29 LAB — GLUCOSE, CAPILLARY
GLUCOSE-CAPILLARY: 84 mg/dL (ref 65–99)
GLUCOSE-CAPILLARY: 75 mg/dL (ref 65–99)
GLUCOSE-CAPILLARY: 87 mg/dL (ref 65–99)
Glucose-Capillary: 93 mg/dL (ref 65–99)

## 2016-10-29 LAB — LACTIC ACID, PLASMA: LACTIC ACID, VENOUS: 3.2 mmol/L — AB (ref 0.5–1.9)

## 2016-10-29 LAB — PROCALCITONIN: PROCALCITONIN: 10.66 ng/mL

## 2016-10-29 LAB — MRSA PCR SCREENING: MRSA by PCR: NEGATIVE

## 2016-10-29 LAB — I-STAT CG4 LACTIC ACID, ED: Lactic Acid, Venous: 2.61 mmol/L (ref 0.5–1.9)

## 2016-10-29 LAB — I-STAT TROPONIN, ED: TROPONIN I, POC: 0 ng/mL (ref 0.00–0.08)

## 2016-10-29 LAB — MAGNESIUM: Magnesium: 1.7 mg/dL (ref 1.7–2.4)

## 2016-10-29 LAB — TROPONIN I: TROPONIN I: 0.08 ng/mL — AB (ref <0.03)

## 2016-10-29 MED ORDER — ACETAMINOPHEN 160 MG/5ML PO SOLN
650.0000 mg | Freq: Four times a day (QID) | ORAL | Status: DC | PRN
  Administered 2016-10-29 – 2016-11-01 (×3): 650 mg
  Filled 2016-10-29 (×3): qty 20.3

## 2016-10-29 MED ORDER — CYCLOSPORINE 0.05 % OP EMUL
1.0000 [drp] | Freq: Two times a day (BID) | OPHTHALMIC | Status: DC
  Administered 2016-10-29 – 2016-11-13 (×30): 1 [drp] via OPHTHALMIC
  Filled 2016-10-29 (×30): qty 1

## 2016-10-29 MED ORDER — PANTOPRAZOLE SODIUM 40 MG IV SOLR
40.0000 mg | Freq: Every day | INTRAVENOUS | Status: DC
  Administered 2016-10-29 – 2016-11-02 (×5): 40 mg via INTRAVENOUS
  Filled 2016-10-29 (×5): qty 40

## 2016-10-29 MED ORDER — INSULIN ASPART 100 UNIT/ML ~~LOC~~ SOLN
0.0000 [IU] | SUBCUTANEOUS | Status: DC
  Administered 2016-10-30 – 2016-11-04 (×9): 2 [IU] via SUBCUTANEOUS
  Administered 2016-11-04: 3 [IU] via SUBCUTANEOUS
  Administered 2016-11-05 – 2016-11-06 (×5): 2 [IU] via SUBCUTANEOUS
  Administered 2016-11-07: 3 [IU] via SUBCUTANEOUS
  Administered 2016-11-08 – 2016-11-12 (×12): 2 [IU] via SUBCUTANEOUS
  Administered 2016-11-12: 3 [IU] via SUBCUTANEOUS

## 2016-10-29 MED ORDER — LEVOTHYROXINE SODIUM 75 MCG PO TABS
75.0000 ug | ORAL_TABLET | Freq: Every day | ORAL | Status: DC
  Administered 2016-10-30 – 2016-11-12 (×14): 75 ug
  Filled 2016-10-29 (×14): qty 1

## 2016-10-29 MED ORDER — LACTATED RINGERS IV BOLUS (SEPSIS)
1000.0000 mL | Freq: Once | INTRAVENOUS | Status: AC
Start: 2016-10-29 — End: 2016-10-29
  Administered 2016-10-29: 1000 mL via INTRAVENOUS

## 2016-10-29 MED ORDER — DOCUSATE SODIUM 50 MG/5ML PO LIQD
100.0000 mg | Freq: Two times a day (BID) | ORAL | Status: DC | PRN
  Administered 2016-11-01 – 2016-11-12 (×3): 100 mg
  Filled 2016-10-29 (×3): qty 10

## 2016-10-29 MED ORDER — FENTANYL CITRATE (PF) 100 MCG/2ML IJ SOLN
50.0000 ug | INTRAMUSCULAR | Status: DC | PRN
  Administered 2016-10-29: 50 ug via INTRAVENOUS
  Filled 2016-10-29: qty 2

## 2016-10-29 MED ORDER — LACTATED RINGERS IV SOLN
INTRAVENOUS | Status: DC
  Administered 2016-10-29 – 2016-10-30 (×2): via INTRAVENOUS

## 2016-10-29 MED ORDER — PROPOFOL 1000 MG/100ML IV EMUL
INTRAVENOUS | Status: AC
  Filled 2016-10-29: qty 100

## 2016-10-29 MED ORDER — PROPOFOL 1000 MG/100ML IV EMUL
5.0000 ug/kg/min | Freq: Once | INTRAVENOUS | Status: AC
  Administered 2016-10-29: 10 ug/kg/min via INTRAVENOUS

## 2016-10-29 MED ORDER — ROCURONIUM BROMIDE 50 MG/5ML IV SOLN
INTRAVENOUS | Status: DC | PRN
  Administered 2016-10-29: 60 mg via INTRAVENOUS

## 2016-10-29 MED ORDER — PREDNISONE 5 MG PO TABS
5.0000 mg | ORAL_TABLET | Freq: Every day | ORAL | Status: DC
  Administered 2016-10-30 – 2016-10-31 (×2): 5 mg
  Filled 2016-10-29 (×4): qty 1

## 2016-10-29 MED ORDER — FENTANYL CITRATE (PF) 100 MCG/2ML IJ SOLN
50.0000 ug | INTRAMUSCULAR | Status: DC | PRN
  Administered 2016-10-29 – 2016-11-12 (×13): 50 ug via INTRAVENOUS
  Filled 2016-10-29 (×14): qty 2

## 2016-10-29 MED ORDER — SODIUM CHLORIDE 0.9 % IV BOLUS (SEPSIS)
500.0000 mL | Freq: Once | INTRAVENOUS | Status: AC
  Administered 2016-10-29: 500 mL via INTRAVENOUS

## 2016-10-29 MED ORDER — PIPERACILLIN-TAZOBACTAM 3.375 G IVPB 30 MIN
3.3750 g | Freq: Once | INTRAVENOUS | Status: AC
  Administered 2016-10-29: 3.375 g via INTRAVENOUS
  Filled 2016-10-29: qty 50

## 2016-10-29 MED ORDER — ALBUTEROL SULFATE (2.5 MG/3ML) 0.083% IN NEBU
2.5000 mg | INHALATION_SOLUTION | RESPIRATORY_TRACT | Status: DC
  Administered 2016-10-29 – 2016-11-10 (×71): 2.5 mg via RESPIRATORY_TRACT
  Filled 2016-10-29 (×71): qty 3

## 2016-10-29 MED ORDER — CHLORHEXIDINE GLUCONATE 0.12% ORAL RINSE (MEDLINE KIT)
15.0000 mL | Freq: Two times a day (BID) | OROMUCOSAL | Status: DC
  Administered 2016-10-29 – 2016-11-05 (×15): 15 mL via OROMUCOSAL

## 2016-10-29 MED ORDER — PIPERACILLIN-TAZOBACTAM 3.375 G IVPB
3.3750 g | Freq: Three times a day (TID) | INTRAVENOUS | Status: AC
  Administered 2016-10-29 – 2016-11-04 (×19): 3.375 g via INTRAVENOUS
  Filled 2016-10-29 (×19): qty 50

## 2016-10-29 MED ORDER — HEPARIN SODIUM (PORCINE) 5000 UNIT/ML IJ SOLN
5000.0000 [IU] | Freq: Three times a day (TID) | INTRAMUSCULAR | Status: DC
  Administered 2016-10-29 – 2016-11-01 (×9): 5000 [IU] via SUBCUTANEOUS
  Filled 2016-10-29 (×9): qty 1

## 2016-10-29 MED ORDER — PROPOFOL 1000 MG/100ML IV EMUL
0.0000 ug/kg/min | INTRAVENOUS | Status: DC
  Administered 2016-10-29: 20 ug/kg/min via INTRAVENOUS
  Administered 2016-10-30 (×2): 25 ug/kg/min via INTRAVENOUS
  Administered 2016-10-30 – 2016-10-31 (×3): 35 ug/kg/min via INTRAVENOUS
  Administered 2016-10-31: 25 ug/kg/min via INTRAVENOUS
  Administered 2016-11-01: 20 ug/kg/min via INTRAVENOUS
  Administered 2016-11-01: 25 ug/kg/min via INTRAVENOUS
  Administered 2016-11-02: 30 ug/kg/min via INTRAVENOUS
  Administered 2016-11-02: 40 ug/kg/min via INTRAVENOUS
  Administered 2016-11-02 (×2): 30 ug/kg/min via INTRAVENOUS
  Administered 2016-11-03: 20 ug/kg/min via INTRAVENOUS
  Administered 2016-11-03 (×2): 30 ug/kg/min via INTRAVENOUS
  Filled 2016-10-29 (×16): qty 100

## 2016-10-29 MED ORDER — ORAL CARE MOUTH RINSE
15.0000 mL | Freq: Four times a day (QID) | OROMUCOSAL | Status: DC
  Administered 2016-10-29 – 2016-11-02 (×15): 15 mL via OROMUCOSAL

## 2016-10-29 MED ORDER — VITAMIN B-6 100 MG PO TABS
100.0000 mg | ORAL_TABLET | Freq: Every day | ORAL | Status: DC
  Administered 2016-10-30 – 2016-11-12 (×14): 100 mg
  Filled 2016-10-29 (×16): qty 1

## 2016-10-29 MED ORDER — VANCOMYCIN HCL IN DEXTROSE 1-5 GM/200ML-% IV SOLN
1000.0000 mg | Freq: Every day | INTRAVENOUS | Status: DC
  Administered 2016-10-29 – 2016-10-31 (×3): 1000 mg via INTRAVENOUS
  Filled 2016-10-29 (×4): qty 200

## 2016-10-29 MED ORDER — ETOMIDATE 2 MG/ML IV SOLN
INTRAVENOUS | Status: DC | PRN
  Administered 2016-10-29: 20 mg via INTRAVENOUS

## 2016-10-29 NOTE — Progress Notes (Signed)
Pharmacy Antibiotic Note  [name] is a 83 y.o. female admitted on 10/29/2016 with pneumonia. The patient was actively vomiting as EMS transported patient to the ED.  Pharmacy has been consulted for vancomycin and Zosyn dosing.  Plan: Vancomycin 1000mg  IV every 24 hours.  Goal trough 15-20 mcg/mL. Zosyn 3.375g IV q8h (4 hour infusion). Follow-up clinical progression  Weight: 135 lb (61.2 kg)  No data recorded.   Recent Labs Lab 10/29/16 1035 10/29/16 1043  WBC 13.7*  --   CREATININE 0.72  --   LATICACIDVEN  --  2.61*    Estimated Creatinine Clearance: 42.8 mL/min (by C-G formula based on SCr of 0.72 mg/dL).    Allergies  Allergen Reactions  . Ivp Dye [Iodinated Diagnostic Agents] Shortness Of Breath  . Metrizamide Shortness Of Breath  . Red Dye     Antimicrobials this admission: 8/7 Vancomycin>> 8/7 Zosyn>>  Dose adjustments this admission:   Microbiology results: 8/7 BCx:  8/7 UCx:  8/7 Sputum:   Thank you for allowing pharmacy to be a part of this patient's care.  [place] 10/29/2016 1:09 PM

## 2016-10-29 NOTE — H&P (Signed)
PULMONARY / CRITICAL CARE MEDICINE   Name: [name] MRN: [id number] DOB: 08/18/1930    ADMISSION DATE:  10/29/2016 CONSULTATION DATE:  10/29/16  REFERRING MD:  Dr. Laverta Baltimore / EDP   CHIEF COMPLAINT:  SOB  HISTORY OF PRESENT ILLNESS:   83 y/o F who presented to [facility] on 8/7 via EMS from a SNF with reports of increasing shortness of breath and confusion.  Hx Myasthenia Gravis (baseline prednisone 5mg , followed by Dr. Jaynee Eagles) and dementia > lives in a memory care unit.   Per reports, the patient has been short of breath since 8/6.  She was combative and hypoxic with EMS who apparently attempted nasal intubation which was unsuccessful. Saturations were in the 70's on NRB.  She also vomited in route with concern for possible aspiration.  BVM ventilation provided until arrival to ER. On arrival to ER, she unresponsive and was intubated for airway protection. Initial labs - Na 141, K 3.8, Cl 107, glucose 193, BUN 15, sr cr 0.72, AG 12, troponin 0.0, lactic acid 2.61, WBC 13.7, Hgb 15, platelets 258.  CXR demonstrated bibasilar fibrosis. Attempts to reach family unsuccessful per ER.    PCCM consulted for ICU admission.    PAST MEDICAL HISTORY :  She  has a past medical history of Arthritis; Diverticulosis of colon; GERD (gastroesophageal reflux disease); H/O hiatal hernia; Hemorrhoid; Macular degeneration; Osteopenia; Seizures ([place]); and Thyroid disease.  PAST SURGICAL HISTORY: She  has a past surgical history that includes Colonoscopy; Carpal tunnel release; Foot surgery; Joint replacement (1990 / 2011); Laser ablation; Femur fracture surgery; Breast surgery; Eye surgery; and Cholecystectomy (04/30/2012).  Allergies  Allergen Reactions  . Ivp Dye [Iodinated Diagnostic Agents] Shortness Of Breath  . Metrizamide Shortness Of Breath  . Red Dye     No current facility-administered medications on file prior to encounter.    Current Outpatient Prescriptions on File Prior to Encounter   Medication Sig  . acetaminophen (TYLENOL) 500 MG tablet Take 500 mg by mouth every 8 (eight) hours.  Marland Kitchen alendronate (FOSAMAX) 70 MG tablet Take 70 mg by mouth once a week. Take with a full glass of water on an empty stomach.  . busPIRone (BUSPAR) 5 MG tablet Take 5 mg by mouth 3 (three) times daily.  . cycloSPORINE (RESTASIS) 0.05 % ophthalmic emulsion Place 1 drop into both eyes 2 (two) times daily.  . furosemide (LASIX) 20 MG tablet Take 20 mg by mouth daily.  Marland Kitchen levothyroxine (SYNTHROID, LEVOTHROID) 75 MCG tablet Take 75 mcg by mouth every morning.  Marland Kitchen LORazepam (ATIVAN) 0.5 MG tablet Take 0.5 mg by mouth as needed for anxiety.  . Multiple Vitamins-Minerals (PRESERVISION AREDS 2) CAPS Take 1 capsule by mouth 2 (two) times daily.  . predniSONE (DELTASONE) 2.5 MG tablet Take 7.5mg  daily for one month then decrease to 5mg  daily with breakfast  . Probiotic Product (ALIGN) 4 MG CAPS Take 1 capsule by mouth daily.   Marland Kitchen pyridOXINE (VITAMIN B-6) 100 MG tablet Take 100 mg by mouth 2 (two) times daily.   . Vitamin D, Ergocalciferol, (DRISDOL) 50000 UNITS CAPS Take 50,000 Units by mouth every 7 (seven) days. Takes on [place]:  Her indicated that her mother is deceased. She indicated that her father is deceased. She indicated that her sister is alive. She indicated that her brother is alive.    SOCIAL HISTORY: She  reports that she has never smoked. She has never used smokeless tobacco. She reports that  she does not drink alcohol or use drugs.  REVIEW OF SYSTEMS:  Unable to complete as patient is altered on mechanical ventilation.   SUBJECTIVE: RN reports hypotension on propofol.    VITAL SIGNS: BP (!) 166/103 (BP Location: Left Arm)   Pulse 99   Resp 18   Wt 135 lb (61.2 kg)   SpO2 96%   BMI 26.37 kg/m   HEMODYNAMICS:    VENTILATOR SETTINGS: Vent Mode: PRVC FiO2 (%):  [100 %] 100 % Set Rate:  [18 bmp] 18 bmp Vt Set:  [500 mL] 500 mL PEEP:  [5 cmH20] 5 cmH20 Plateau  Pressure:  [22 cmH20] 22 cmH20  INTAKE / OUTPUT: No intake/output data recorded.  PHYSICAL EXAMINATION: General: elderly female on vent HEENT: MM pink/moist, ETT, bloody nose Neuro: opens eyes to voice, nods yes/no to questions, wiggles toes, moves all extremities  CV: s1s2 rrr, no m/r/g PULM: even/non-labored, lungs bilaterally coarse with scattered rhonchi HA:LPFX, non-tender, bsx4 active  Extremities: warm/dry, no edema  Skin: no rashes or lesions  LABS:  BMET  Recent Labs Lab 10/29/16 1035  NA 141  K 3.8  CL 107  CO2 22  BUN 15  CREATININE 0.72  GLUCOSE 193*    Electrolytes  Recent Labs Lab 10/29/16 1035  CALCIUM 9.2    CBC  Recent Labs Lab 10/29/16 1035  WBC 13.7*  HGB 15.0  HCT 47.4*  PLT 258    Coag's No results for input(s): APTT, INR in the last 168 hours.  Sepsis Markers  Recent Labs Lab 10/29/16 1043  LATICACIDVEN 2.61*    ABG  Recent Labs Lab 10/29/16 1103  PHART 7.332*  PCO2ART 48.3*  PO2ART 61.0*    Liver Enzymes No results for input(s): AST, ALT, ALKPHOS, BILITOT, ALBUMIN in the last 168 hours.  Cardiac Enzymes No results for input(s): TROPONINI, PROBNP in the last 168 hours.  Glucose No results for input(s): GLUCAP in the last 168 hours.  Imaging Dg Chest Portable 1 View  Result Date: 10/29/2016 CLINICAL DATA:  Intubation EXAM: PORTABLE CHEST 1 VIEW COMPARISON:  CT chest of 05/20/2016 FINDINGS: The tip of the endotracheal tube is low only 12 mm above the carina. OG tube extends into the stomach with the tip in the expected region of the distal antrum or duodenal bulb. Basilar fibrosis again is noted. No pneumonia or effusion is seen. Heart size is stable. IMPRESSION: 1. Endotracheal somewhat low in position only 12 mm above the carina. 2. OG tube extends into the stomach with the tip in the region of the distal antrum or duodenal bulb. 3. No pneumonia or effusion.  Bibasilar fibrosis. Electronically Signed   By: Ivar Drape M.D.   On: 10/29/2016 10:56     STUDIES:    CULTURES: Sputum 8/7 >>  BCx2 8/7 >>  UC 8/7 >>   ANTIBIOTICS: Vanco 8/7 >>  Zosyn 8/7 >>   SIGNIFICANT EVENTS: 8/07  Admit with SOB, intubated   LINES/TUBES: ETT 8/7 >>   DISCUSSION: 83 y/o F with reported MG on prednisone and advanced dementia (per report, lives in a memory care unit) who was admitted 8/7 with increased SOB.  EMS attempted intubation nasally > pt vomited, concern for aspiration.  Intubated in ER  ASSESSMENT / PLAN:  PULMONARY A: Acute Hypoxic Respiratory Failure - concern for aspiration (chronic vs acute with intubation attempt), note no hypercarbia  Suspected Aspiration P:   PRVC 8 cc/kg  Wean PEEP / FiO2 for sats > 90% Intermittent  CXR  Aspiration precautions  Daily SBT / WUA  NIF / VC as below  CARDIOVASCULAR A:  SIRS / Sepsis - suspected aspiration in setting of dementia  P:  ICU monitoring  See ID  Trend troponin 1L LR bolus now  RENAL A: Mild Elevation of Lactic Acid  P:   Trend BMP / urinary output Replace electrolytes as indicated Avoid nephrotoxic agents, ensure adequate renal perfusion  GASTROINTESTINAL A:   Nausea / Vomiting - suspect related to nasal intubation attempt  P:   NPO  OGT to LIS PPI for SUP   HEMATOLOGIC A:   Mild Leukocytosis  P:  Monitor CBC  Heparin for DVT prophylaxis   INFECTIOUS A:   SIRS / Sepsis Concern for Aspiration   P:   Assess cultures as above  Empiric abx for possible aspiration  ENDOCRINE A:   Hypothyroidism  Hyperglycemia    P:   Continue synthroid Monitor glucose on BMP   NEUROLOGIC A:   Myasthenia Gravis - on baseline prednisone 5mg  QD, followed by Dr. Jaynee Eagles  P:   RASS goal: 0 to -1  Serial neuro exams  Neurology consult > may need PLEX, has required in past Propofol for sedation  Serial NIF & VC   FAMILY  - Updates: Attempted to reach family in ER.  Unable to reach family.  Will attempt to call again  -  Inter-disciplinary family meet or Palliative Care meeting due by: 8/16   CC Time: 93 minutes   Noe Gens, NP-C [facility] Pgr: [phone] or if no answer [phone] 10/29/2016, 11:53 AM

## 2016-10-29 NOTE — Progress Notes (Signed)
eLink Physician-Brief Progress Note Patient Name: BETZY BARBIER DOB: 1930-11-03 MRN: [id number]   Date of Service  10/29/2016  HPI/Events of Note  Has fever.  Already on Abx and cultures pending.  eICU Interventions  Prn tylenol.      Intervention Category Major Interventions: Other:  Avaleen Brownley 10/29/2016, 11:35 PM

## 2016-10-29 NOTE — Progress Notes (Signed)
Propofol was running prior to pt's arrival in ICU but doesn't seem to have been scanned.  The same bottle of propofol is currently running at this time.

## 2016-10-29 NOTE — Consult Note (Signed)
NEURO HOSPITALIST CONSULT NOTE   Requestig physician: Dr.    Luiz Iron for Consult: possible myasthenia exacerbation   History obtained from:  chart     HPI:                                                                                                                                          [name] is an 83 y.o. female with a known diagnosis of myasthenia gravis.  Patient is well known to Dr. Jaynee Eagles . Looking through from her previous note "Patient is a poor historian and she says she had "to learn how to swallow food". She cannot answer questions. She says they "had to keep it out of my lungs". It appears her myasthenia gravis exacerbations include ocular and swallowing issues. Patient has received PLEX treatment in the past along with high-dose prednisone which is helped her myasthenia gravis. Per last note on 08/07/2006 patient had been doing fine on 10 mg of prednisone a day and plan was to decrease to 7.5 mg for a month and then 5 mg and continue on 5 mg daily. Apparently over the last few days patient had been short of breath and today the nursing home called EMS secondary to patient having saturation in the 60s. Initially she was attempted to be intubated but due to her alertness they tried nasal intubation which then caused patient to have a bout of emesis and possible aspiration. Currently she is intubated however does show limited ocular movements.  Past Medical History:  Diagnosis Date  . Arthritis   . Diverticulosis of colon   . GERD (gastroesophageal reflux disease)   . H/O hiatal hernia   . Hemorrhoid   . Macular degeneration   . Osteopenia   . Seizures ([place])    X1 AT AGE 42  - NONE SINCE  . Thyroid disease     Past Surgical History:  Procedure Laterality Date  . BREAST SURGERY     BREAST BX   . CARPAL TUNNEL RELEASE    . CHOLECYSTECTOMY  04/30/2012   Procedure: LAPAROSCOPIC CHOLECYSTECTOMY WITH INTRAOPERATIVE CHOLANGIOGRAM;  Surgeon: Earnstine Regal, MD;  Location: [facility];  Service: General;  Laterality: N/A;  . COLONOSCOPY    . EYE SURGERY     CATARACTS  . FEMUR FRACTURE SURGERY    . FOOT SURGERY    . JOINT REPLACEMENT  1990 / 2011   BIL KNEE REPLACEDMENTS  . LASER ABLATION     FOR PHLEBITIS    Family History  Problem Relation Age of Onset  . Cancer Mother   . Heart attack Father   . Prostate cancer Brother      Social History:  reports that she has never smoked. She has never used smokeless tobacco. She  reports that she does not drink alcohol or use drugs.  Allergies  Allergen Reactions  . Ivp Dye [Iodinated Diagnostic Agents] Shortness Of Breath  . Metrizamide Shortness Of Breath  . Red Dye     MEDICATIONS:                                                                                                                     Current Facility-Administered Medications  Medication Dose Route Frequency Provider Last Rate Last Dose  . albuterol (PROVENTIL) (2.5 MG/3ML) 0.083% nebulizer solution 2.5 mg  2.5 mg Nebulization Q4H Erick Colace, NP      . chlorhexidine gluconate (MEDLINE KIT) (PERIDEX) 0.12 % solution 15 mL  15 mL Mouth Rinse BID Erick Colace, NP      . cycloSPORINE (RESTASIS) 0.05 % ophthalmic emulsion 1 drop  1 drop Both Eyes BID Erick Colace, NP      . docusate (COLACE) 50 MG/5ML liquid 100 mg  100 mg Per Tube BID PRN Erick Colace, NP      . etomidate (AMIDATE) injection   Intravenous PRN Margette Fast, MD   20 mg at 10/29/16 1028  . fentaNYL (SUBLIMAZE) injection 50 mcg  50 mcg Intravenous Q15 min PRN Erick Colace, NP      . fentaNYL (SUBLIMAZE) injection 50 mcg  50 mcg Intravenous Q2H PRN Erick Colace, NP      . heparin injection 5,000 Units  5,000 Units Subcutaneous Q8H Erick Colace, NP      . insulin aspart (novoLOG) injection 0-15 Units  0-15 Units Subcutaneous Q4H Salvadore Dom E, NP      . lactated ringers bolus 1,000 mL  1,000 mL Intravenous Once Erick Colace, NP       . lactated ringers infusion   Intravenous Continuous Erick Colace, NP      . levothyroxine (SYNTHROID, LEVOTHROID) tablet 75 mcg  75 mcg Per Tube q morning - 10a Erick Colace, NP      . MEDLINE mouth rinse  15 mL Mouth Rinse QID Erick Colace, NP      . pantoprazole (PROTONIX) injection 40 mg  40 mg Intravenous Daily Erick Colace, NP      . predniSONE (DELTASONE) tablet 5 mg  5 mg Per Tube Daily Salvadore Dom E, NP      . propofol (DIPRIVAN) 1000 MG/100ML infusion           . propofol (DIPRIVAN) 1000 MG/100ML infusion  0-50 mcg/kg/min Intravenous Continuous Erick Colace, NP      . pyridOXINE (VITAMIN B-6) tablet 100 mg  100 mg Per Tube Daily Erick Colace, NP      . rocuronium Alfredia Client) injection   Intravenous PRN Margette Fast, MD   60 mg at 10/29/16 1029   Current Outpatient Prescriptions  Medication Sig Dispense Refill  . acetaminophen (TYLENOL) 500 MG tablet Take 500 mg by mouth 3 (three) times daily.     Marland Kitchen  alendronate (FOSAMAX) 70 MG tablet Take 70 mg by mouth once a week. Take with a full glass of water on an empty stomach.    . cycloSPORINE (RESTASIS) 0.05 % ophthalmic emulsion Place 1 drop into both eyes 2 (two) times daily.    . furosemide (LASIX) 20 MG tablet Take 20 mg by mouth daily.    Marland Kitchen levothyroxine (SYNTHROID, LEVOTHROID) 75 MCG tablet Take 75 mcg by mouth every morning.    Marland Kitchen LORazepam (ATIVAN) 0.5 MG tablet Take 0.5 mg by mouth at bedtime.     . Multiple Vitamins-Minerals (PRESERVISION AREDS 2) CAPS Take 1 capsule by mouth daily.     . predniSONE (DELTASONE) 10 MG tablet Take 10 mg by mouth daily with breakfast.    . Probiotic Product (ALIGN) 4 MG CAPS Take 1 capsule by mouth daily.     Marland Kitchen pyridOXINE (VITAMIN B-6) 100 MG tablet Take 100 mg by mouth 2 (two) times daily.     . ranitidine (ZANTAC) 75 MG tablet Take 75 mg by mouth 2 (two) times daily.    . Vitamin D, Ergocalciferol, (DRISDOL) 50000 UNITS CAPS Take 50,000 Units by mouth every 7 (seven)  days.         ROS:                                                                                                                                       History obtained from unobtainable from patient due to Intubated    Blood pressure (!) 166/103, pulse 99, resp. rate 18, weight 61.2 kg (135 lb), SpO2 96 %.   Neurologic Examination:                                                                                                      HEENT-  Normocephalic, no lesions, without obvious abnormality.  Normal external eye and conjunctiva.  Normal TM's bilaterally.  Normal auditory canals and external ears. Normal external nose, mucus membranes and septum.  Normal pharynx. Cardiovascular- S1, S2 normal, pulses palpable throughout   Lungs- chest clear, no wheezing, rales, normal symmetric air entry Abdomen- normal findings: bowel sounds normal Extremities- no edema Lymph-no adenopathy palpable Musculoskeletal-no joint tenderness, deformity or swelling Skin-warm and dry, no hyperpigmentation, vitiligo, or suspicious lesions  Neurological Examination Mental Status: Patient currently is intubated. Breathing over that. Able to follow commands. Cranial Nerves: II: Blinks to threat bilaterally III,IV, VI: She does not appear to have any ptosis however she does not appear to  fully verse to the left, unclear how much of this is  cooperation versus true difficulty.  V,VII: Sensation intact VIII: hearing normal bilaterally IX,X: Unable to visual XI: Intact XII: Unable to visualize Motor: Patient moving all extremities with 5 out of 5 strength Sensory: Pinprick and light touch intact throughout, bilaterally Deep Tendon Reflexes: 2+ and symmetric throughout Plantars: Right: downgoing   Left: downgoing Cerebellar: normal finger-to-nose,  Gait: Not tested      Lab Results: Basic Metabolic Panel:  Recent Labs Lab 10/29/16 1035  NA 141  K 3.8  CL 107  CO2 22  GLUCOSE 193*  BUN 15   CREATININE 0.72  CALCIUM 9.2    Liver Function Tests: No results for input(s): AST, ALT, ALKPHOS, BILITOT, PROT, ALBUMIN in the last 168 hours. No results for input(s): LIPASE, AMYLASE in the last 168 hours. No results for input(s): AMMONIA in the last 168 hours.  CBC:  Recent Labs Lab 10/29/16 1035  WBC 13.7*  NEUTROABS 7.8*  HGB 15.0  HCT 47.4*  MCV 97.7  PLT 258    Cardiac Enzymes: No results for input(s): CKTOTAL, CKMB, CKMBINDEX, TROPONINI in the last 168 hours.  Lipid Panel: No results for input(s): CHOL, TRIG, HDL, CHOLHDL, VLDL, LDLCALC in the last 168 hours.  CBG: No results for input(s): GLUCAP in the last 168 hours.  Microbiology: Results for orders placed or performed during the hospital encounter of 04/21/12  Surgical pcr screen     Status: None   Collection Time: 04/21/12 11:14 AM  Result Value Ref Range Status   MRSA, PCR NEGATIVE NEGATIVE Final   Staphylococcus aureus NEGATIVE NEGATIVE Final    Comment:        The Xpert SA Assay (FDA approved for NASAL specimens in patients over 20 years of age), is one component of a comprehensive surveillance program.  Test performance has been validated by [employer] for patients greater than or equal to 50 year old. It is not intended to diagnose infection nor to guide or monitor treatment.    Coagulation Studies: No results for input(s): LABPROT, INR in the last 72 hours.  Imaging: Dg Chest Portable 1 View  Result Date: 10/29/2016 CLINICAL DATA:  Intubation EXAM: PORTABLE CHEST 1 VIEW COMPARISON:  CT chest of 05/20/2016 FINDINGS: The tip of the endotracheal tube is low only 12 mm above the carina. OG tube extends into the stomach with the tip in the expected region of the distal antrum or duodenal bulb. Basilar fibrosis again is noted. No pneumonia or effusion is seen. Heart size is stable. IMPRESSION: 1. Endotracheal somewhat low in position only 12 mm above the carina. 2. OG tube extends into the  stomach with the tip in the region of the distal antrum or duodenal bulb. 3. No pneumonia or effusion.  Bibasilar fibrosis. Electronically Signed   By: Ivar Drape M.D.   On: 10/29/2016 10:56       Assessment and plan per attending neurologist  Etta Quill PA-C [facility] [phone]  10/29/2016, 12:42 PM   Assessment/Plan: 83 year old female with progressive shortness of breath. Interestingly, she was still too awake to intubate with severe hypoxemia. Respiratory failure in MG is usually hypercarbic as opposed to hypoxemic and there is concern for aspiration. At this point, I'm not clear whether respiratory failure was primarily pulmonary or neuromuscular. Would favor observation with NIF and VC with consideration of doing plasma exchange if it ends up looking like this is MG exacerbation as opposed to aspiration.  1) continue prednisone 10 mg daily 2) NIF and VC 3) may need plasma exchange if this appears to be MG exacerbation as opposed to pneumonia 4) neurology will continue to follow  Roland Rack, MD [facility] [phone]  If 7pm- 7am, please page neurology on call as listed in [place].

## 2016-10-29 NOTE — Progress Notes (Addendum)
eLink Physician-Brief Progress Note Patient Name: [name] DOB: 1930/07/25 MRN: [id number]   Date of Service  10/29/2016  HPI/Events of Note  Intubated 83 yo female admitted with myasthenia now with aspiration pneumonia.   eICU Interventions  Stable on cam check, RT to continue to wean down oxygen as tolerated.         Laverle Hobby 10/29/2016, 3:33 PM

## 2016-10-29 NOTE — ED Notes (Signed)
Report called  

## 2016-10-29 NOTE — Progress Notes (Signed)
Pt intubated by EDP with 7.5 ETT secured at 23 at lip woth hollister tube holder. Pt placed on vent setting PRVC 500/18/100/5. Pt tol well. Will cont to monitor

## 2016-10-29 NOTE — ED Provider Notes (Signed)
   INTUBATION Performed by: Larene Pickett  Required items: required blood products, implants, devices, and special equipment available Patient identity confirmed: provided demographic data and hospital-assigned identification number Time out: Immediately prior to procedure a "time out" was called to verify the correct patient, procedure, equipment, support staff and site/side marked as required.  Indications: airway protection, unresponsiveness  Intubation method: Glidescope Laryngoscopy   Preoxygenation: BVM  Sedatives: Etomidate Paralytic: Rocuronium  Tube Size: 7.5 cuffed  Post-procedure assessment: chest rise and ETCO2 monitor Breath sounds: equal and absent over the epigastrium Tube secured with: ETT holder Chest x-ray interpreted by radiologist and me.  Chest x-ray findings: endotracheal tube in appropriate position  Patient tolerated the procedure well with no immediate complications.      Larene Pickett, PA-C 10/29/16 1149    Long, [facility], MD 10/29/16 [phone]

## 2016-10-29 NOTE — ED Notes (Signed)
Per EMS- pt is from facility was reported to have increased SOB and work of breathing. Pt has hx of myastinia gravis. Pt was combative and hypoxic with EMS.

## 2016-10-29 NOTE — ED Provider Notes (Signed)
Emergency Department Provider Note   I have reviewed the triage vital signs and the nursing notes.   HISTORY  Chief Complaint Respiratory Distress   HPI [name] is a 83 y.o. female with PMH of myasthenia gravis, GERD, and seizure disorder presents to the emergency department in acute respiratory distress. Patient apparently began feeling symptoms of shortness of breath yesterday consistent with myasthenia crisis. Per EMS she began making suicidal statements because she's had this happen in the past. EMS was called today with acute worsening of symptoms including increased work of breathing, hypoxemia, and confusion.  In route EMS report that the patient became less responsive. They attempted nasal intubation unsuccessfully and provided BVM ventilation until arriving at the emergency department. They report the patient is a full code.    Past Medical History:  Diagnosis Date  . Arthritis   . Diverticulosis of colon   . GERD (gastroesophageal reflux disease)   . H/O hiatal hernia   . Hemorrhoid   . Macular degeneration   . Osteopenia   . Seizures ([place])    X1 AT AGE 89  - NONE SINCE  . Thyroid disease     Patient Active Problem List   Diagnosis Date Noted  . Acute respiratory failure with hypoxia ([place]) 10/29/2016  . Myasthenia gravis ([place]) 12/12/2015  . Myasthenic crisis ([place]) 12/12/2015  . Cholelithiasis with cholecystitis 04/15/2012    Past Surgical History:  Procedure Laterality Date  . BREAST SURGERY     BREAST BX   . CARPAL TUNNEL RELEASE    . CHOLECYSTECTOMY  04/30/2012   Procedure: LAPAROSCOPIC CHOLECYSTECTOMY WITH INTRAOPERATIVE CHOLANGIOGRAM;  Surgeon: Earnstine Regal, MD;  Location: [facility];  Service: General;  Laterality: N/A;  . COLONOSCOPY    . EYE SURGERY     CATARACTS  . FEMUR FRACTURE SURGERY    . FOOT SURGERY    . JOINT REPLACEMENT  1990 / 2011   BIL KNEE REPLACEDMENTS  . LASER ABLATION     FOR PHLEBITIS      Allergies Ivp dye  [iodinated diagnostic agents]; Metrizamide; and Red dye  Family History  Problem Relation Age of Onset  . Cancer Mother   . Heart attack Father   . Prostate cancer Brother     Social History Social History  Substance Use Topics  . Smoking status: Never Smoker  . Smokeless tobacco: Never Used  . Alcohol use No    Review of Systems  Level 5 caveat: Patient minimally responsive and in acute respiratory distress.   ____________________________________________   PHYSICAL EXAM:  VITAL SIGNS: ED Triage Vitals  Enc Vitals Group     BP --      Pulse Rate 10/29/16 1029 (!) 109     Resp 10/29/16 1029 (!) 24     SpO2 10/29/16 1027 (!) 66 %   Vitals:   10/29/16 1700 10/29/16 1800  BP: (!) 98/47 (!) 106/55  Pulse: 96 83  Resp: 18 18  Temp:      Constitutional: Somnolent but grimacing and flexing to pain.  Eyes: Conjunctivae are normal.  Head: Atraumatic. Nose: No congestion/rhinnorhea. Mouth/Throat: Mucous membranes are moist. Vomit on shirt and chin on arrival.  Neck: No stridor.  Cardiovascular: Normal rate, regular rhythm. Cool extremities with poor peripheral circulation. Grossly normal heart sounds.   Respiratory: Significant increased respiratory effort.  No retractions. Lungs diffusely course throughout.  Gastrointestinal: Soft and nontender. No distention.  Musculoskeletal: No lower extremity tenderness nor edema. No gross deformities  of extremities. Neurologic: Localizing with both upper and lower extremities to pain. GCS 7. Skin:  Skin is warm, dry and intact. No rash noted.  ____________________________________________   LABS (all labs ordered are listed, but only abnormal results are displayed)  Labs Reviewed  CBC WITH DIFFERENTIAL/PLATELET - Abnormal; Notable for the following:       Result Value   WBC 13.7 (*)    HCT 47.4 (*)    Neutro Abs 7.8 (*)    Lymphs Abs 5.0 (*)    All other components within normal limits  BASIC METABOLIC PANEL - Abnormal;  Notable for the following:    Glucose, Bld 193 (*)    All other components within normal limits  URINALYSIS, ROUTINE W REFLEX MICROSCOPIC - Abnormal; Notable for the following:    Glucose, UA 50 (*)    Ketones, ur 5 (*)    Protein, ur 100 (*)    Bacteria, UA RARE (*)    Squamous Epithelial / LPF 0-5 (*)    All other components within normal limits  I-STAT ARTERIAL BLOOD GAS, ED - Abnormal; Notable for the following:    pH, Arterial 7.332 (*)    pCO2 arterial 48.3 (*)    pO2, Arterial 61.0 (*)    All other components within normal limits  I-STAT CG4 LACTIC ACID, ED - Abnormal; Notable for the following:    Lactic Acid, Venous 2.61 (*)    All other components within normal limits  MRSA PCR SCREENING  URINE CULTURE  CULTURE, BLOOD (ROUTINE X 2)  CULTURE, BLOOD (ROUTINE X 2)  CULTURE, RESPIRATORY (NON-EXPECTORATED)  GLUCOSE, CAPILLARY  GLUCOSE, CAPILLARY  PROCALCITONIN  CBC  CREATININE, SERUM  MAGNESIUM  PHOSPHORUS  TROPONIN I  TROPONIN I  TROPONIN I  LACTIC ACID, PLASMA  LACTIC ACID, PLASMA  STREP PNEUMONIAE URINARY ANTIGEN  LEGIONELLA PNEUMOPHILA SEROGP 1 UR AG  TRIGLYCERIDES  TSH  T4, FREE  HEMOGLOBIN A1C  LACTIC ACID, PLASMA  PROCALCITONIN  CBC  BASIC METABOLIC PANEL  BLOOD GAS, ARTERIAL  MAGNESIUM  PHOSPHORUS  I-STAT TROPONIN, ED  I-STAT CG4 LACTIC ACID, ED   ____________________________________________  EKG   EKG Interpretation  Date/Time:  Tuesday October 29 2016 10:35:58 EDT Ventricular Rate:  100 PR Interval:    QRS Duration: 156 QT Interval:  323 QTC Calculation: 417 R Axis:   76 Text Interpretation:  Sinus tachycardia IVCD, consider atypical RBBB ST depr, consider ischemia, anterolateral lds No STEMI. No old trcing for comparison.  Confirmed by Nanda Quinton [phone]) on 10/29/2016 10:41:40 AM       ____________________________________________  RADIOLOGY  Dg Chest Portable 1 View  Result Date: 10/29/2016 CLINICAL DATA:  Intubation EXAM:  PORTABLE CHEST 1 VIEW COMPARISON:  CT chest of 05/20/2016 FINDINGS: The tip of the endotracheal tube is low only 12 mm above the carina. OG tube extends into the stomach with the tip in the expected region of the distal antrum or duodenal bulb. Basilar fibrosis again is noted. No pneumonia or effusion is seen. Heart size is stable. IMPRESSION: 1. Endotracheal somewhat low in position only 12 mm above the carina. 2. OG tube extends into the stomach with the tip in the region of the distal antrum or duodenal bulb. 3. No pneumonia or effusion.  Bibasilar fibrosis. Electronically Signed   By: Ivar Drape M.D.   On: 10/29/2016 10:56    ____________________________________________   PROCEDURES  Procedure(s) performed:   Procedures  CRITICAL CARE Performed by: Margette Fast Total critical care time:  70 minutes Critical care time was exclusive of separately billable procedures and treating other patients. Critical care was necessary to treat or prevent imminent or life-threatening deterioration. Critical care was time spent personally by me on the following activities: development of treatment plan with patient and/or surrogate as well as nursing, discussions with consultants, evaluation of patient's response to treatment, examination of patient, obtaining history from patient or surrogate, ordering and performing treatments and interventions, ordering and review of laboratory studies, ordering and review of radiographic studies, pulse oximetry and re-evaluation of patient's condition.  Nanda Quinton, MD Emergency Medicine  ____________________________________________   INITIAL IMPRESSION / ASSESSMENT AND PLAN / ED COURSE  Pertinent labs & imaging results that were available during my care of the patient were reviewed by me and considered in my medical decision making (see chart for details).  Patient presents to the emergency department in acute respiratory distress with hypoxemia. She is  undergoing bag valve mask ventilation on arrival. She is minimally responsive. GCS 7 on arrival. Patient with no myasthenia gravis. Patient intubated for acute respiratory failure and hypoxemia on the first attempt. Patient maintained blood pressure and pulse throughout the procedure. Starting propofol for sedation. Plan to obtain labs, portable chest x-ray, reassess.  Patient tolerating vent well. Sedation adequate. Labs and imaging reviewed. No family at bedside in the ED.   Discussed patient's case with Critical Care. Care transferred to Critical Care service.  I reviewed all nursing notes, vitals, pertinent old records, EKGs, labs, imaging (as available).  ____________________________________________  FINAL CLINICAL IMPRESSION(S) / ED DIAGNOSES  Final diagnoses:  Acute respiratory failure with hypoxia ([place])     MEDICATIONS GIVEN DURING THIS VISIT:  Medications  etomidate (AMIDATE) injection (20 mg Intravenous Given 10/29/16 1028)  rocuronium (ZEMURON) injection (60 mg Intravenous Given 10/29/16 1029)  propofol (DIPRIVAN) 1000 MG/100ML infusion (20 mcg/kg/min  Rate/Dose Change 10/29/16 1400)  predniSONE (DELTASONE) tablet 5 mg (5 mg Per Tube Not Given 10/29/16 1430)  cycloSPORINE (RESTASIS) 0.05 % ophthalmic emulsion 1 drop (not administered)  levothyroxine (SYNTHROID, LEVOTHROID) tablet 75 mcg (75 mcg Per Tube Not Given 10/29/16 1345)  insulin aspart (novoLOG) injection 0-15 Units (0 Units Subcutaneous Not Given 10/29/16 1600)  heparin injection 5,000 Units (5,000 Units Subcutaneous Given 10/29/16 1457)  lactated ringers infusion ( Intravenous Rate/Dose Verify 10/29/16 1800)  albuterol (PROVENTIL) (2.5 MG/3ML) 0.083% nebulizer solution 2.5 mg (2.5 mg Nebulization Given 10/29/16 1932)  pantoprazole (PROTONIX) injection 40 mg (40 mg Intravenous Given 10/29/16 1700)  chlorhexidine gluconate (MEDLINE KIT) (PERIDEX) 0.12 % solution 15 mL (15 mLs Mouth Rinse Given 10/29/16 1400)  MEDLINE mouth rinse (15 mLs  Mouth Rinse Given 10/29/16 1503)  fentaNYL (SUBLIMAZE) injection 50 mcg (50 mcg Intravenous Given 10/29/16 1927)  fentaNYL (SUBLIMAZE) injection 50 mcg (50 mcg Intravenous Given 10/29/16 1410)  propofol (DIPRIVAN) 1000 MG/100ML infusion (20 mcg/kg/min  61.2 kg Intravenous Rate/Dose Verify 10/29/16 1800)  docusate (COLACE) 50 MG/5ML liquid 100 mg (not administered)  pyridOXINE (VITAMIN B-6) tablet 100 mg (100 mg Per Tube Not Given 10/29/16 1430)  vancomycin (VANCOCIN) IVPB 1000 mg/200 mL premix (0 mg Intravenous Stopped 10/29/16 1559)  piperacillin-tazobactam (ZOSYN) IVPB 3.375 g (not administered)  propofol (DIPRIVAN) 1000 MG/100ML infusion (10 mcg/kg/min  61.2 kg Intravenous New Bag/Given 10/29/16 1100)  lactated ringers bolus 1,000 mL (0 mLs Intravenous Stopped 10/29/16 1430)  piperacillin-tazobactam (ZOSYN) IVPB 3.375 g (0 g Intravenous Stopped 10/29/16 1529)     NEW OUTPATIENT MEDICATIONS STARTED DURING THIS VISIT:  None   Note:  This  document was prepared using Systems analyst and may include unintentional dictation errors.  Nanda Quinton, MD Emergency Medicine    Long, Wonda [facility], MD 10/29/16 Joen Laura

## 2016-10-29 NOTE — Progress Notes (Signed)
CRITICAL VALUE ALERT  Critical Value:  Lactic Acid 3.2 & Troponin 0.08  Date & Time Notied: 10/29/16 2200  Provider Notified: RN Elta Guadeloupe at [facility]  Orders Received/Actions taken: Called Elink and reported lab values.

## 2016-10-29 NOTE — Progress Notes (Signed)
RN notified MD via Warren Lacy pertaining to low BP and MAP.  Rn advised to monitor and observe.

## 2016-10-29 NOTE — Progress Notes (Signed)
eLink Physician-Brief Progress Note Patient Name: [name] DOB: 09/29/1930 MRN: [id number]   Date of Service  10/29/2016  HPI/Events of Note  Lactic acid 3.2  eICU Interventions  Bolus 500 cc NS.      Intervention Category Major Interventions: Acid-Base disturbance - evaluation and management  Laverle Hobby 10/29/2016, 10:21 PM

## 2016-10-29 NOTE — Progress Notes (Signed)
Initial Nutrition Assessment  INTERVENTION:   Recommend: Vital AF 1.2 @ 40 ml/hr (960 ml/day) 30 ml Prostat daily Provides: 1252 kcal, 87 grams protein, and 778 ml free water.  TF regimen and propofol at current rate providing 1299 total kcal/day (100 % of kcal needs)   NUTRITION DIAGNOSIS:   Inadequate oral intake related to inability to eat as evidenced by NPO status.  GOAL:   Patient will meet greater than or equal to 90% of their needs  MONITOR:   I & O's, Vent status  REASON FOR ASSESSMENT:   Consult  (TF recommendations)  ASSESSMENT:   Pt with PMH of dementia (resides at memory care unit at [facility]) and  myasthenia gravis on prednisone daily admitted with SOB. Nasotracheal intubation attempted but unsuccessful and resulted in emesis with possible aspiration PNA.    Pt discussed during ICU rounds and with RN.  Pt on multiple antibiotics. LP done today with elevated WBC. Pt is weaning.   Patient is currently intubated on ventilator support MV: 6.9 L/min Temp (24hrs), Avg:99.3 F (37.4 C), Min:98.2 F (36.8 C), Max:101.4 F (38.6 C)  Propofol: 1.8 ml/hr provides: 47 kcal per day from lipids Medications reviewed and include: SSI, synthroid, prednisone, vitamin B6 IVF: LR @ 100 ml/hr Labs reviewed MAP: 68/71/70/68 Nutrition-Focused physical exam completed. Findings are no fat depletion, no muscle depletion, and no edema. Limited exam on BLE, pt would jerk when I attempted to touch her.    Diet Order:  Diet NPO time specified  Skin:  Reviewed, no issues  Last BM:  unknown  Height:   Ht Readings from Last 1 Encounters:  10/30/16 5\' 1"  (1.549 m)    Weight:   Wt Readings from Last 1 Encounters:  10/30/16 136 lb 11 oz (62 kg)    Ideal Body Weight:  47.7 kg  BMI:  Body mass index is 25.83 kg/m.  Estimated Nutritional Needs:   Kcal:  1300  Protein:  75-90 grams  Fluid:  > 1.5 L/day  EDUCATION NEEDS:   No education needs identified at this  time  [place], [place], [place] Pager [phone] After Hours Pager

## 2016-10-30 ENCOUNTER — Encounter (HOSPITAL_COMMUNITY): Payer: Self-pay

## 2016-10-30 ENCOUNTER — Inpatient Hospital Stay (HOSPITAL_COMMUNITY): Payer: Medicare Other

## 2016-10-30 LAB — BLOOD GAS, ARTERIAL
ACID-BASE DEFICIT: 1.5 mmol/L (ref 0.0–2.0)
BICARBONATE: 21.5 mmol/L (ref 20.0–28.0)
Drawn by: 418751
FIO2: 60
LHR: 18 {breaths}/min
O2 SAT: 96.9 %
PATIENT TEMPERATURE: 98.6
PCO2 ART: 29.3 mmHg — AB (ref 32.0–48.0)
PEEP/CPAP: 5 cmH2O
PO2 ART: 83.8 mmHg (ref 83.0–108.0)
VT: 500 mL
pH, Arterial: 7.48 — ABNORMAL HIGH (ref 7.350–7.450)

## 2016-10-30 LAB — CBC
HCT: 34.1 % — ABNORMAL LOW (ref 36.0–46.0)
Hemoglobin: 11.2 g/dL — ABNORMAL LOW (ref 12.0–15.0)
MCH: 31.1 pg (ref 26.0–34.0)
MCHC: 32.8 g/dL (ref 30.0–36.0)
MCV: 94.7 fL (ref 78.0–100.0)
PLATELETS: 197 10*3/uL (ref 150–400)
RBC: 3.6 MIL/uL — ABNORMAL LOW (ref 3.87–5.11)
RDW: 14.7 % (ref 11.5–15.5)
WBC: 14.8 10*3/uL — AB (ref 4.0–10.5)

## 2016-10-30 LAB — GLUCOSE, CAPILLARY
GLUCOSE-CAPILLARY: 91 mg/dL (ref 65–99)
GLUCOSE-CAPILLARY: 90 mg/dL (ref 65–99)
GLUCOSE-CAPILLARY: 79 mg/dL (ref 65–99)
Glucose-Capillary: 101 mg/dL — ABNORMAL HIGH (ref 65–99)
Glucose-Capillary: 126 mg/dL — ABNORMAL HIGH (ref 65–99)
Glucose-Capillary: 93 mg/dL (ref 65–99)

## 2016-10-30 LAB — HEMOGLOBIN A1C
Hgb A1c MFr Bld: 5.2 %
Mean Plasma Glucose: 102.54 mg/dL

## 2016-10-30 LAB — BASIC METABOLIC PANEL
ANION GAP: 8 (ref 5–15)
BUN: 16 mg/dL (ref 6–20)
CHLORIDE: 112 mmol/L — AB (ref 101–111)
CO2: 21 mmol/L — AB (ref 22–32)
Calcium: 7.7 mg/dL — ABNORMAL LOW (ref 8.9–10.3)
Creatinine, Ser: 0.82 mg/dL (ref 0.44–1.00)
GFR calc non Af Amer: >60 mL/min (ref >60)
Glucose, Bld: 107 mg/dL — ABNORMAL HIGH (ref 65–99)
POTASSIUM: 4 mmol/L (ref 3.5–5.1)
Sodium: 141 mmol/L (ref 135–145)

## 2016-10-30 LAB — T4, FREE: Free T4: 1.09 ng/dL (ref 0.61–1.12)

## 2016-10-30 LAB — TROPONIN I: Troponin I: 0.09 ng/mL (ref <0.03)

## 2016-10-30 LAB — URINE CULTURE
Culture: NO GROWTH
Special Requests: NORMAL

## 2016-10-30 LAB — MAGNESIUM: Magnesium: 1.7 mg/dL (ref 1.7–2.4)

## 2016-10-30 LAB — PROCALCITONIN: PROCALCITONIN: 12.47 ng/mL

## 2016-10-30 LAB — PHOSPHORUS: Phosphorus: 2.8 mg/dL (ref 2.5–4.6)

## 2016-10-30 LAB — TSH: TSH: 1.025 u[IU]/mL (ref 0.350–4.500)

## 2016-10-30 LAB — TRIGLYCERIDES: TRIGLYCERIDES: 69 mg/dL (ref <150)

## 2016-10-30 LAB — LACTIC ACID, PLASMA
LACTIC ACID, VENOUS: 2.1 mmol/L — AB (ref 0.5–1.9)
Lactic Acid, Venous: 3 mmol/L (ref 0.5–1.9)

## 2016-10-30 MED ORDER — SODIUM CHLORIDE 0.9 % IV BOLUS (SEPSIS)
1000.0000 mL | Freq: Once | INTRAVENOUS | Status: AC
  Administered 2016-10-30: 1000 mL via INTRAVENOUS

## 2016-10-30 MED ORDER — HEPARIN SODIUM (PORCINE) 1000 UNIT/ML IJ SOLN
1000.0000 [IU] | INTRAMUSCULAR | Status: DC | PRN
  Administered 2016-10-30: 1000 [IU] via INTRAVENOUS

## 2016-10-30 NOTE — Progress Notes (Signed)
Subjective: No significant changes  Exam: Vitals:   10/30/16 0900 10/30/16 1000  BP: (!) 127/48 (!) 119/50  Pulse: 89 89  Resp: (!) 22 (!) 23  Temp:     Gen: In bed, NAD Resp: non-labored breathing, no acute distress Abd: soft, nt  Neuro: MS: Awake, alert, answers questions readily CN: Endorses double vision, but no clear disconjugate gaze, versions appear full Motor: She has 3-4/5 neck flexion weakness, good strength otherwise Sensory: Intact light touch  Pertinent Labs: Borderline calcium  Impression: 83 year old female with myasthenia gravis is presenting with respiratory failure. Initial presentation was unclear, but given the progressive dysphagia, I suspect that she may have had an aspiration event secondary to myasthenic flare causing a mixed picture of her respiratory failure. I would favor starting plasma change at this time.  Recommendations: 1) plasma exchange, planning for a total of 5 treatments 2) continue prednisone at the current dose, could increase following initiation of plasma exchange  Roland Rack, MD [facility] [phone]  If 7pm- 7am, please page neurology on call as listed in [place].

## 2016-10-30 NOTE — Progress Notes (Signed)
CRITICAL VALUE ALERT  Critical Value:  Lactic Acid 3.0  Date & Time Notied:  10/30/16 0257  Provider Notified: Warren Lacy   Orders Received/Actions taken: Called Elink and reported values.

## 2016-10-30 NOTE — Progress Notes (Addendum)
PULMONARY / CRITICAL CARE MEDICINE   Name: [name] MRN: [id number] DOB: 09/21/1930    ADMISSION DATE:  10/29/2016 CONSULTATION DATE:  10/29/16  REFERRING MD:  Dr. Laverta Baltimore / EDP   CHIEF COMPLAINT:  SOB  HISTORY OF PRESENT ILLNESS:   83 y/o F who presented to [facility] on 8/7 via EMS from a SNF with reports of increasing shortness of breath and confusion.  Hx Myasthenia Gravis (baseline prednisone 5mg , followed by Dr. Jaynee Eagles) and dementia > lives in a memory care unit.   Per reports, the patient has been short of breath since 8/6.  She was combative and hypoxic with EMS who apparently attempted nasal intubation which was unsuccessful. Saturations were in the 70's on NRB.  She also vomited in route with concern for possible aspiration.  BVM ventilation provided until arrival to ER. On arrival to ER, she unresponsive and was intubated for airway protection. Initial labs - Na 141, K 3.8, Cl 107, glucose 193, BUN 15, sr cr 0.72, AG 12, troponin 0.0, lactic acid 2.61, WBC 13.7, Hgb 15, platelets 258.  CXR demonstrated bibasilar fibrosis. Attempts to reach family unsuccessful per ER.   PCCM consulted for ICU admission.   PAST MEDICAL HISTORY :  She  has a past medical history of Arthritis; Diverticulosis of colon; GERD (gastroesophageal reflux disease); H/O hiatal hernia; Hemorrhoid; Macular degeneration; Osteopenia; Seizures ([place]); and Thyroid disease.  PAST SURGICAL HISTORY: She  has a past surgical history that includes Colonoscopy; Carpal tunnel release; Foot surgery; Joint replacement (1990 / 2011); Laser ablation; Femur fracture surgery; Breast surgery; Eye surgery; and Cholecystectomy (04/30/2012).  Allergies  Allergen Reactions  . Ivp Dye [Iodinated Diagnostic Agents] Shortness Of Breath  . Metrizamide Shortness Of Breath  . Red Dye     No current facility-administered medications on file prior to encounter.    Current Outpatient Prescriptions on File Prior to Encounter   Medication Sig  . acetaminophen (TYLENOL) 500 MG tablet Take 500 mg by mouth 3 (three) times daily.   Marland Kitchen alendronate (FOSAMAX) 70 MG tablet Take 70 mg by mouth once a week. Take with a full glass of water on an empty stomach.  . cycloSPORINE (RESTASIS) 0.05 % ophthalmic emulsion Place 1 drop into both eyes 2 (two) times daily.  . furosemide (LASIX) 20 MG tablet Take 20 mg by mouth daily.  Marland Kitchen levothyroxine (SYNTHROID, LEVOTHROID) 75 MCG tablet Take 75 mcg by mouth every morning.  Marland Kitchen LORazepam (ATIVAN) 0.5 MG tablet Take 0.5 mg by mouth at bedtime.   . Multiple Vitamins-Minerals (PRESERVISION AREDS 2) CAPS Take 1 capsule by mouth daily.   . Probiotic Product (ALIGN) 4 MG CAPS Take 1 capsule by mouth daily.   Marland Kitchen pyridOXINE (VITAMIN B-6) 100 MG tablet Take 100 mg by mouth 2 (two) times daily.   . Vitamin D, Ergocalciferol, (DRISDOL) 50000 UNITS CAPS Take 50,000 Units by mouth every 7 (seven) days.     FAMILY HISTORY:  Her indicated that her mother is deceased. She indicated that her father is deceased. She indicated that her sister is alive. She indicated that her brother is alive.    SOCIAL HISTORY: She  reports that she has never smoked. She has never used smokeless tobacco. She reports that she does not drink alcohol or use drugs.  REVIEW OF SYSTEMS:  Unable to complete as patient is altered on mechanical ventilation.   SUBJECTIVE: Stable overnight On weaning trial today AM.    VITAL SIGNS: BP (!) 119/50  Pulse 89   Temp 98.8 F (37.1 C) (Axillary)   Resp (!) 23   Ht 5\' 1"  (1.549 m) Comment: per chart review  Wt 136 lb 11 oz (62 kg)   SpO2 95%   BMI 25.83 kg/m   HEMODYNAMICS:    VENTILATOR SETTINGS: Vent Mode: CPAP;PSV FiO2 (%):  [40 %-100 %] 40 % Set Rate:  [18 bmp] 18 bmp Vt Set:  [500 mL] 500 mL PEEP:  [5 cmH20] 5 cmH20 Pressure Support:  [5 cmH20] 5 cmH20 Plateau Pressure:  [18 cmH20-20 cmH20] 18 cmH20  INTAKE / OUTPUT: I/O last 3 completed shifts: In: 2498.4  [I.V.:1648.4; IV Piggyback:850] Out: 480 [Urine:480]  PHYSICAL EXAMINATION: Gen:      No acute distress HEENT:  EOMI, sclera anicteric Neck:     No masses; no thyromegaly, ETT in place Lungs:    Clear to auscultation bilaterally; normal respiratory effort CV:         Regular rate and rhythm; no murmurs Abd:      + bowel sounds; soft, non-tender; no palpable masses, no distension Ext:    No edema; adequate peripheral perfusion Skin:      Warm and dry; no rash Neuro: Awake, responsive  LABS:  BMET  Recent Labs Lab 10/29/16 1035 10/29/16 2106 10/30/16 0524  NA 141  --  141  K 3.8  --  4.0  CL 107  --  112*  CO2 22  --  21*  BUN 15  --  16  CREATININE 0.72 0.74 0.82  GLUCOSE 193*  --  107*    Electrolytes  Recent Labs Lab 10/29/16 1035 10/29/16 2106 10/30/16 0524  CALCIUM 9.2  --  7.7*  MG  --  1.7 1.7  PHOS  --  2.8 2.8    CBC  Recent Labs Lab 10/29/16 1035 10/30/16 0524  WBC 13.7* 14.8*  HGB 15.0 11.2*  HCT 47.4* 34.1*  PLT 258 197    Coag's No results for input(s): APTT, INR in the last 168 hours.  Sepsis Markers  Recent Labs Lab 10/29/16 2106 10/30/16 0257 10/30/16 0524  LATICACIDVEN 3.2* 3.0* 2.1*  PROCALCITON 10.66  --  12.47    ABG  Recent Labs Lab 10/29/16 1103 10/30/16 0400  PHART 7.332* 7.480*  PCO2ART 48.3* 29.3*  PO2ART 61.0* 83.8    Liver Enzymes No results for input(s): AST, ALT, ALKPHOS, BILITOT, ALBUMIN in the last 168 hours.  Cardiac Enzymes  Recent Labs Lab 10/29/16 2106 10/29/16 2330 10/30/16 0524  TROPONINI 0.08* 0.09* 0.60*    Glucose  Recent Labs Lab 10/29/16 1532 10/29/16 2000 10/29/16 2333 10/30/16 0323 10/30/16 0746 10/30/16 1127  GLUCAP 75 87 93 101* 91 126*    Imaging Dg Chest Port 1 View  Result Date: 10/30/2016 CLINICAL DATA:  Pneumonia. EXAM: PORTABLE CHEST 1 VIEW COMPARISON:  Radiograph of October 29, 2016. FINDINGS: Stable cardiomediastinal silhouette. Nasogastric tube is seen  entering stomach. Distal tip of endotracheal tube is only 9 mm above the carina ; withdrawal by 2-3 cm is recommended. No pneumothorax is noted. Mild bibasilar subsegmental atelectasis or infiltrates are noted. No significant pleural effusion is noted. Degenerative changes seen involving both glenohumeral joints. IMPRESSION: Mild bibasilar subsegmental atelectasis or infiltrates. Distal tip of endotracheal tube is only 9 mm above the carina; withdrawal by 2-3 cm is recommended. These results will be called to the ordering clinician or representative by the Radiologist Assistant, and communication documented in the PACS or zVision Dashboard. Electronically Signed   By:  Marijo Conception, M.D.   On: 10/30/2016 08:08    STUDIES:   CULTURES: Sputum 8/7 >>  BCx2 8/7 >>  UC 8/7 >>   ANTIBIOTICS: Vanco 8/7 >>  Zosyn 8/7 >>   SIGNIFICANT EVENTS: 8/07  Admit with SOB, intubated   LINES/TUBES: ETT 8/7 >>   DISCUSSION: 83 y/o F with reported MG on prednisone and advanced dementia (per report, lives in a memory care unit) who was admitted 8/7 with increased SOB.  EMS attempted intubation nasally > pt vomited, concern for aspiration.  Intubated in ER  ASSESSMENT / PLAN:  PULMONARY A: Acute Hypoxic Respiratory Failure - concern for aspiration (chronic vs acute with intubation attempt), note no hypercarbia  Suspected Aspiration P:   Continue vent support Daily SBTs. Has a boderline trial today am and thick secretions. Will reassess tomorrow Follow CXR Retract ETT Aspiration precautions  Follow NIF / VC  CARDIOVASCULAR A:  SIRS / Sepsis - suspected aspiration in setting of dementia  P:  ICU monitoring  Follow troponins  RENAL A: Mild Elevation of Lactic Acid  P:   Trend BMP / urinary output Replace electrolytes as indicated Avoid nephrotoxic agents, ensure adequate renal perfusion  GASTROINTESTINAL A:   Nausea / Vomiting - suspect related to nasal intubation attempt  P:   Start  tube feeds PPI for SUP   HEMATOLOGIC A:   Mild Leukocytosis  P:  Monitor CBC  Heparin for DVT prophylaxis   INFECTIOUS A:   SIRS / Sepsis Concern for Aspiration   P:   Assess cultures as above  Continue antibiotics for aspiration  ENDOCRINE A:   Hypothyroidism  Hyperglycemia    P:   Continue synthroid Monitor glucose on BMP   NEUROLOGIC A:   Myasthenia Gravis - on baseline prednisone 5mg  QD, followed by Dr. Jaynee Eagles  P:   RASS goal: 0 to -1  Serial neuro exams  Neurology consult appreciated Propofol for sedation  Follow serial NIF, VC  FAMILY  - Updates: No family at bedside - Inter-disciplinary family meet or Palliative Care meeting due by: 8/16  The patient is critically ill with multiple organ system failure and requires high complexity decision making for assessment and support, frequent evaluation and titration of therapies, advanced monitoring, review of radiographic studies and interpretation of complex data.   Critical Care Time devoted to patient care services, exclusive of separately billable procedures, described in this note is 35 minutes.   Marshell Garfinkel MD [facility] Pager [phone] If no answer or after 3pm call: [phone] 10/30/2016, 11:57 AM

## 2016-10-30 NOTE — Progress Notes (Signed)
NIF performed with good effort for result of -18 and VC done with result of 576

## 2016-10-30 NOTE — Progress Notes (Addendum)
CRITICAL VALUE ALERT  Critical Value:  Lactic Acid 2.1 & Troponin 0.60  Date & Time Notied:  10/30/16 0626  Provider Notified: Warren Lacy  Orders Received/Actions taken: Called Elink and reported value.

## 2016-10-30 NOTE — Progress Notes (Signed)
[place] Progress Note Patient Name: [name] DOB: 1931/01/06 MRN: [id number]   Date of Service  10/30/2016  HPI/Events of Note  Hypotensive.  eICU Interventions  Will give 1 liter NS bolus.     Intervention Category Major Interventions: Other:  Javoris Star 10/30/2016, 12:04 AM

## 2016-10-30 NOTE — Procedures (Signed)
Central Venous Hemodialysis Catheter Insertion Procedure Note [name] [id number] 1930/12/02  Procedure: Insertion of Central Venous Catheter Indications: PLEX therapy  Procedure Details Consent: Risks of procedure as well as the alternatives and risks of each were explained to the (patient/caregiver).  Consent for procedure obtained. Time Out: Verified patient identification, verified procedure, site/side was marked, verified correct patient position, special equipment/implants available, medications/allergies/relevent history reviewed, required imaging and test results available.  Performed  Maximum sterile technique was used including antiseptics, cap, gloves, gown, hand hygiene, mask and sheet. Skin prep: Chlorhexidine; local anesthetic administered A antimicrobial bonded/coated triple lumen catheter was placed in the right internal jugular vein using the Seldinger technique sutured at 15 cm.  Biopatch and sterile dressing placed.  RN to insert 1.2 ml of heparin 1000 unit/ml per red and blue port.  Evaluation Blood flow good Complications: No apparent complications Patient did tolerate procedure well. Chest X-ray ordered to verify placement.  CXR: pending.  Procedure performed with ultrasound guidance for real time vessel cannulation.     Kennieth Rad, AGACNP-BC [facility] Pgr: [phone] or if no answer [phone] 10/30/2016, 5:08 PM

## 2016-10-30 NOTE — Progress Notes (Signed)
[place] Progress Note Patient Name: [name] DOB: 1930-10-04 MRN: [id number]   Date of Service  10/30/2016  HPI/Events of Note  Fluctuating BP.  Had improvement after previous fluid bolus.  Lactic acid improved, but still up.   eICU Interventions  Will give additional 1 liter NS fluid bolus.  If BP drops again, then consider adding pressor agent.      Intervention Category Major Interventions: Other:  Rani Sisney 10/30/2016, 4:53 AM

## 2016-10-31 ENCOUNTER — Inpatient Hospital Stay (HOSPITAL_COMMUNITY): Payer: Medicare Other

## 2016-10-31 LAB — TROPONIN I
TROPONIN I: 0.05 ng/mL — AB (ref <0.03)
Troponin I: 0.05 ng/mL (ref <0.03)
Troponin I: 0.06 ng/mL (ref <0.03)
Troponin I: 0.05 ng/mL (ref <0.03)

## 2016-10-31 LAB — CBC
HEMATOCRIT: 34 % — AB (ref 36.0–46.0)
HEMOGLOBIN: 11 g/dL — AB (ref 12.0–15.0)
MCH: 30.5 pg (ref 26.0–34.0)
MCHC: 32.4 g/dL (ref 30.0–36.0)
MCV: 94.2 fL (ref 78.0–100.0)
PLATELETS: 146 10*3/uL — AB (ref 150–400)
RBC: 3.61 MIL/uL — AB (ref 3.87–5.11)
RDW: 14.8 % (ref 11.5–15.5)
WBC: 10.2 10*3/uL (ref 4.0–10.5)

## 2016-10-31 LAB — BASIC METABOLIC PANEL
Anion gap: 9 (ref 5–15)
Anion gap: 9 (ref 5–15)
BUN: 10 mg/dL (ref 6–20)
BUN: 8 mg/dL (ref 6–20)
CHLORIDE: 111 mmol/L (ref 101–111)
CO2: 23 mmol/L (ref 22–32)
CO2: 18 mmol/L — ABNORMAL LOW (ref 22–32)
CREATININE: 0.78 mg/dL (ref 0.44–1.00)
CREATININE: 0.73 mg/dL (ref 0.44–1.00)
Calcium: 8 mg/dL — ABNORMAL LOW (ref 8.9–10.3)
Calcium: 7.7 mg/dL — ABNORMAL LOW (ref 8.9–10.3)
Chloride: 119 mmol/L — ABNORMAL HIGH (ref 101–111)
GFR calc Af Amer: >60 mL/min (ref >60)
GLUCOSE: 93 mg/dL (ref 65–99)
Glucose, Bld: 93 mg/dL (ref 65–99)
POTASSIUM: 2.8 mmol/L — AB (ref 3.5–5.1)
POTASSIUM: 3.1 mmol/L — AB (ref 3.5–5.1)
SODIUM: 143 mmol/L (ref 135–145)
Sodium: 146 mmol/L — ABNORMAL HIGH (ref 135–145)

## 2016-10-31 LAB — GLUCOSE, CAPILLARY
GLUCOSE-CAPILLARY: 83 mg/dL (ref 65–99)
Glucose-Capillary: 92 mg/dL (ref 65–99)
Glucose-Capillary: 98 mg/dL (ref 65–99)
Glucose-Capillary: 96 mg/dL (ref 65–99)
Glucose-Capillary: 77 mg/dL (ref 65–99)

## 2016-10-31 LAB — BLOOD GAS, ARTERIAL
Acid-base deficit: 1.6 mmol/L (ref 0.0–2.0)
BICARBONATE: 21.3 mmol/L (ref 20.0–28.0)
DRAWN BY: 418751
FIO2: 40
MECHVT: 500 mL
O2 Saturation: 91.9 %
PEEP/CPAP: 5 cmH2O
PO2 ART: 58.5 mmHg — AB (ref 83.0–108.0)
Patient temperature: 98.6
RATE: 18 resp/min
pCO2 arterial: 27.8 mmHg — ABNORMAL LOW (ref 32.0–48.0)
pH, Arterial: 7.496 — ABNORMAL HIGH (ref 7.350–7.450)

## 2016-10-31 LAB — PROCALCITONIN: Procalcitonin: 6.78 ng/mL

## 2016-10-31 LAB — MAGNESIUM: MAGNESIUM: 2.1 mg/dL (ref 1.7–2.4)

## 2016-10-31 LAB — PHOSPHORUS: PHOSPHORUS: 2.4 mg/dL — AB (ref 2.5–4.6)

## 2016-10-31 MED ORDER — HEPARIN SODIUM (PORCINE) 1000 UNIT/ML IJ SOLN: 1000.0000 [IU] | Freq: Once | INTRAMUSCULAR | Status: DC

## 2016-10-31 MED ORDER — ACD FORMULA A 0.73-2.45-2.2 GM/100ML VI SOLN
500.0000 mL | Status: DC
  Administered 2016-10-31: 09:00:00 via INTRAVENOUS

## 2016-10-31 MED ORDER — CALCIUM GLUCONATE 10 % IV SOLN
2.0000 g | Freq: Once | INTRAVENOUS | Status: AC
  Administered 2016-10-31: 2 g via INTRAVENOUS
  Filled 2016-10-31: qty 20

## 2016-10-31 MED ORDER — SODIUM CHLORIDE 0.9 % IV BOLUS (SEPSIS)
500.0000 mL | Freq: Once | INTRAVENOUS | Status: AC
  Administered 2016-10-31: 500 mL via INTRAVENOUS

## 2016-10-31 MED ORDER — POTASSIUM CHLORIDE 10 MEQ/50ML IV SOLN
10.0000 meq | INTRAVENOUS | Status: AC
  Administered 2016-10-31 – 2016-11-01 (×4): 10 meq via INTRAVENOUS
  Filled 2016-10-31 (×4): qty 50

## 2016-10-31 MED ORDER — ACETAMINOPHEN 325 MG PO TABS: 650.0000 mg | ORAL_TABLET | ORAL | Status: DC | PRN

## 2016-10-31 MED ORDER — FUROSEMIDE 10 MG/ML IJ SOLN
20.0000 mg | Freq: Once | INTRAMUSCULAR | Status: AC
  Administered 2016-10-31: 20 mg via INTRAVENOUS

## 2016-10-31 MED ORDER — ACD FORMULA A 0.73-2.45-2.2 GM/100ML VI SOLN
Status: AC
  Filled 2016-10-31: qty 500

## 2016-10-31 MED ORDER — CALCIUM CARBONATE ANTACID 500 MG PO CHEW
2.0000 | CHEWABLE_TABLET | ORAL | Status: AC
  Administered 2016-10-31 (×2): 400 mg via ORAL
  Filled 2016-10-31 (×2): qty 2

## 2016-10-31 MED ORDER — CHLORHEXIDINE GLUCONATE 0.12 % MT SOLN
OROMUCOSAL | Status: AC
  Administered 2016-10-31: 09:00:00
  Filled 2016-10-31: qty 15

## 2016-10-31 MED ORDER — POTASSIUM CHLORIDE 10 MEQ/50ML IV SOLN
10.0000 meq | INTRAVENOUS | Status: AC
  Administered 2016-10-31 (×4): 10 meq via INTRAVENOUS
  Filled 2016-10-31 (×4): qty 50

## 2016-10-31 MED ORDER — FUROSEMIDE 10 MG/ML IJ SOLN
INTRAMUSCULAR | Status: AC
  Filled 2016-10-31: qty 2

## 2016-10-31 MED ORDER — DIPHENHYDRAMINE HCL 25 MG PO CAPS: 25.0000 mg | ORAL_CAPSULE | Freq: Four times a day (QID) | ORAL | Status: DC | PRN

## 2016-10-31 MED ORDER — ALBUMIN HUMAN 25 % IV SOLN
INTRAVENOUS | Status: AC
  Administered 2016-10-31 (×4): via INTRAVENOUS_CENTRAL
  Filled 2016-10-31 (×4): qty 200

## 2016-10-31 NOTE — Progress Notes (Signed)
eLink Physician-Brief Progress Note Patient Name: [name] DOB: 11-06-1930 MRN: [id number]   Date of Service  10/31/2016  HPI/Events of Note  Call from bedside nurse reporting hypotension with systolic BP of 87N.  On propofol for sedation while on vent.  eICU Interventions  Plan: Fluid bolus for BP support Nurse to reduce sedation to facilitate improvement in BP.     Intervention Category Intermediate Interventions: Hypotension - evaluation and management  Aarib Pulido,Deanne 10/31/2016, 11:41 PM

## 2016-10-31 NOTE — Progress Notes (Signed)
Attempted to perform NIF and VC with patient this AM however patient is refusing to participate.  Will attempt again later.  Will continue to monitor.

## 2016-10-31 NOTE — Care Management Note (Signed)
Case Management Note  Patient Details  Name: [name] MRN: [id number] Date of Birth: 1930/09/30  Subjective/Objective: Pt admitted on 10/29/16 with acute hypoxic respiratory failure, SIRS, and myasthenia gravis flare.  PTA, pt resided at skilled nursing facility.                      Action/Plan: Pt currently remains intubated; on plasmapheresis.  Will follow for discharge planning as pt progresses.   Expected Discharge Date:                  Expected Discharge Plan:  Skilled Nursing Facility  In-House Referral:  Clinical Social Work  Discharge planning Services     Post Acute Care Choice:    Choice offered to:     DME Arranged:    DME Agency:     HH Arranged:    [place] Agency:     Status of Service:  In process, will continue to follow  If discussed at Long Length of Stay Meetings, dates discussed:    Additional Comments:  Reinaldo Raddle, RN, BSN  Trauma/Neuro ICU Case Manager [phone]

## 2016-10-31 NOTE — Progress Notes (Signed)
Pt to sedated to due NIF/VC at this time. Rt will cont to monitor pt.

## 2016-10-31 NOTE — Progress Notes (Signed)
Subjective: PLEX delayed due to technical difficulties.   Exam: Vitals:   10/31/16 1400 10/31/16 1500  BP: (!) 112/57 103/69  Pulse: 84 91  Resp: 18 18  Temp:    SpO2: 100% 97%   Gen: In bed, NAD Resp: non-labored breathing, no acute distress Abd: soft, nt  Neuro: MS: Awake, alert, answers questions readily CN: Endorses double vision, but no clear disconjugate gaze, versions appear full Motor: She has 3-4/5 neck flexion weakness, good strength otherwise Sensory: Intact light touch  Pertinent Labs: Borderline calcium  Impression: 83 year old female with myasthenia gravis is presenting with respiratory failure. Initial presentation was unclear, but given the progressive dysphagia, I suspect that she may have had an aspiration event secondary to myasthenic flare causing a mixed picture of her respiratory failure. I would favor starting plasma change at this time.  Recommendations: 1) plasma exchange, planning for a total of 5 treatments 2) continue prednisone at the current dose,  increase starting tomorrow.   Roland Rack, MD [facility] [phone]  If 7pm- 7am, please page neurology on call as listed in [place].

## 2016-10-31 NOTE — Progress Notes (Signed)
PULMONARY / CRITICAL CARE MEDICINE   Name: [name] MRN: [id number] DOB: 1931-01-06    ADMISSION DATE:  10/29/2016 CONSULTATION DATE:  10/29/16  REFERRING MD:  Dr. Laverta Baltimore / EDP   CHIEF COMPLAINT:  SOB  HISTORY OF PRESENT ILLNESS:   83 y/o F who presented [facility] on [facility]/7 via EMS from a SNF with reports of increasing shortness of breath and confusion.  Hx Myasthenia Gravis (baseline prednisone 5mg , followed by Dr. Jaynee Eagles) and dementia > lives in a memory care unit.   Per reports, the patient has been short of breath since 8/6.  She was combative and hypoxic with EMS who apparently attempted nasal intubation which was unsuccessful. Saturations were in the 70's on NRB.  She also vomited in route with concern for possible aspiration.  BVM ventilation provided until arrival to ER. On arrival to ER, she unresponsive and was intubated for airway protection. Initial labs - Na 141, K 3.8, Cl 107, glucose 193, BUN 15, sr cr 0.72, AG 12, troponin 0.0, lactic acid 2.61, WBC 13.7, Hgb 15, platelets 258.  CXR demonstrated bibasilar fibrosis. Attempts to reach family unsuccessful per ER.   PCCM consulted for ICU admission.   PAST MEDICAL HISTORY :  She  has a past medical history of Arthritis; Diverticulosis of colon; GERD (gastroesophageal reflux disease); H/O hiatal hernia; Hemorrhoid; Macular degeneration; Osteopenia; Seizures ([place]); and Thyroid disease.  PAST SURGICAL HISTORY: She  has a past surgical history that includes Colonoscopy; Carpal tunnel release; Foot surgery; Joint replacement (1990 / 2011); Laser ablation; Femur fracture surgery; Breast surgery; Eye surgery; and Cholecystectomy (04/30/2012).  Allergies  Allergen Reactions  . Ivp Dye [Iodinated Diagnostic Agents] Shortness Of Breath  . Metrizamide Shortness Of Breath  . Red Dye     No current facility-administered medications on file prior to encounter.    Current Outpatient Prescriptions on File Prior to Encounter   Medication Sig  . acetaminophen (TYLENOL) 500 MG tablet Take 500 mg by mouth 3 (three) times daily.   Marland Kitchen alendronate (FOSAMAX) 70 MG tablet Take 70 mg by mouth once a week. Take with a full glass of water on an empty stomach.  . cycloSPORINE (RESTASIS) 0.05 % ophthalmic emulsion Place 1 drop into both eyes 2 (two) times daily.  . furosemide (LASIX) 20 MG tablet Take 20 mg by mouth daily.  Marland Kitchen levothyroxine (SYNTHROID, LEVOTHROID) 75 MCG tablet Take 75 mcg by mouth every morning.  Marland Kitchen LORazepam (ATIVAN) 0.5 MG tablet Take 0.5 mg by mouth at bedtime.   . Multiple Vitamins-Minerals (PRESERVISION AREDS 2) CAPS Take 1 capsule by mouth daily.   . Probiotic Product (ALIGN) 4 MG CAPS Take 1 capsule by mouth daily.   Marland Kitchen pyridOXINE (VITAMIN B-6) 100 MG tablet Take 100 mg by mouth 2 (two) times daily.   . Vitamin D, Ergocalciferol, (DRISDOL) 50000 UNITS CAPS Take 50,000 Units by mouth every 7 (seven) days.     FAMILY HISTORY:  Her indicated that her mother is deceased. She indicated that her father is deceased. She indicated that her sister is alive. She indicated that her brother is alive.    SOCIAL HISTORY: She  reports that she has never smoked. She has never used smokeless tobacco. She reports that she does not drink alcohol or use drugs.  REVIEW OF SYSTEMS:  Unable to complete as patient is on mechanical ventilation.   SUBJECTIVE: HD cath placed Starting plasmapheresis today for total 5 treatments  VITAL SIGNS: BP (!) 105/95  Pulse 94   Temp 99.2 F (37.3 C) (Axillary)   Resp (!) 26   Ht 5\' 1"  (1.549 m)   Wt 144 lb 6.4 oz (65.5 kg)   SpO2 95%   BMI 27.28 kg/m   HEMODYNAMICS:    VENTILATOR SETTINGS: Vent Mode: PSV;CPAP FiO2 (%):  [40 %] 40 % Set Rate:  [18 bmp] 18 bmp Vt Set:  [500 mL] 500 mL PEEP:  [5 cmH20] 5 cmH20 Pressure Support:  [8 cmH20] 8 cmH20 Plateau Pressure:  [18 cmH20-21 cmH20] 18 cmH20  INTAKE / OUTPUT: I/O last 3 completed shifts: In: 4933.2 [I.V.:3933.2; IV  Piggyback:1000] Out: 4818 [Urine:1075; Emesis/NG output:470]  PHYSICAL EXAMINATION:. Gen:      No acute distress HEENT:  EOMI, sclera anicteric Neck:     No masses; no thyromegaly, ETT in place Lungs:    Clear to auscultation bilaterally; normal respiratory effort CV:         Regular rate and rhythm; no murmurs Abd:      + bowel sounds; soft, non-tender; no palpable masses, no distension Ext:    No edema; adequate peripheral perfusion Skin:      Warm and dry; no rash Neuro: Awake, obeys commands  LABS:  BMET  Recent Labs Lab 10/29/16 1035 10/29/16 2106 10/30/16 0524 10/31/16 0341  NA 141  --  141 143  K 3.8  --  4.0 2.8*  CL 107  --  112* 111  CO2 22  --  21* 23  BUN 15  --  16 10  CREATININE 0.72 0.74 0.82 0.78  GLUCOSE 193*  --  107* 93    Electrolytes  Recent Labs Lab 10/29/16 1035 10/29/16 2106 10/30/16 0524 10/31/16 0341  CALCIUM 9.2  --  7.7* 8.0*  MG  --  1.7 1.7 2.1  PHOS  --  2.8 2.8 2.4*    CBC  Recent Labs Lab 10/29/16 1035 10/30/16 0524 10/31/16 0341  WBC 13.7* 14.8* 10.2  HGB 15.0 11.2* 11.0*  HCT 47.4* 34.1* 34.0*  PLT 258 197 146*    Coag's No results for input(s): APTT, INR in the last 168 hours.  Sepsis Markers  Recent Labs Lab 10/29/16 2106 10/30/16 0257 10/30/16 0524 10/31/16 0341  LATICACIDVEN 3.2* 3.0* 2.1*  --   PROCALCITON 10.66  --  12.47 6.78    ABG  Recent Labs Lab 10/29/16 1103 10/30/16 0400 10/31/16 0332  PHART 7.332* 7.480* 7.496*  PCO2ART 48.3* 29.3* 27.8*  PO2ART 61.0* 83.8 58.5*    Liver Enzymes No results for input(s): AST, ALT, ALKPHOS, BILITOT, ALBUMIN in the last 168 hours.  Cardiac Enzymes  Recent Labs Lab 10/29/16 2106 10/29/16 2330 10/30/16 0524  TROPONINI 0.08* 0.09* 0.60*    Glucose  Recent Labs Lab 10/30/16 1127 10/30/16 1536 10/30/16 1936 10/30/16 2311 10/31/16 0308 10/31/16 0803  GLUCAP 126* 90 93 79 83 92    Imaging Dg Chest Port 1 View  Result Date:  10/30/2016 CLINICAL DATA:  Encounter for central line placement. EXAM: PORTABLE CHEST 1 VIEW COMPARISON:  10/30/2016 at 5:52 a.m. FINDINGS: New dual-lumen right internal jugular central venous catheter. Tip projects at the caval atrial junction. No pneumothorax. Endotracheal tube tip projects just above the chronic, unchanged. Nasogastric tube passes below the diaphragm well into the stomach, also stable. There is persistent basilar opacity, most likely atelectasis, without significant change. No new lung abnormalities. IMPRESSION: 1. New right internal jugular dual-lumen central venous catheter. Tip projects at the caval atrial junction. No pneumothorax. Electronically Signed  By: Lajean Manes M.D.   On: 10/30/2016 18:55    STUDIES:   CULTURES: Sputum 8/7 >>  BCx2 8/7 >>  UC 8/7 >>   ANTIBIOTICS: Vanco 8/7 >>  Zosyn 8/7 >>   SIGNIFICANT EVENTS: 8/07  Admit with SOB, intubated   LINES/TUBES: ETT 8/7 >>   DISCUSSION: 83 y/o F with reported MG on prednisone and advanced dementia (per report, lives in a memory care unit) who was admitted 8/7 with increased SOB.  EMS attempted intubation nasally > pt vomited, concern for aspiration.  Intubated in ER.  Starting plasmapheresis 8/9 for myasthenia flare  ASSESSMENT / PLAN:  PULMONARY A: Acute Hypoxic Respiratory Failure - concern for aspiration (chronic vs acute with intubation attempt), note no hypercarbia  Suspected Aspiration P:   Continue vent support Daily SBTs. Will assess for extubation post plasmapherisis todau Aspiration precautions  Follow NIF / VC  CARDIOVASCULAR A:  SIRS / Sepsis - suspected aspiration in setting of dementia  Elevated troponin. ? demand P:  Follow troponin CXR today reviewed shows worsening edema. Give lasix 20 mg iv once.  Check echo  RENAL A: Mild Elevation of Lactic Acid. Resolved P:   Trend BMP / urinary output Replace electrolytes as indicated Avoid nephrotoxic agents, ensure adequate  renal perfusion  GASTROINTESTINAL A:   Nausea / Vomiting - suspect related to nasal intubation attempt  P:   Start tube feeds if unable to exubate today PPI for SUP   HEMATOLOGIC A:   Mild Leukocytosis  P:  Monitor CBC  Heparin for DVT prophylaxis   INFECTIOUS A:   SIRS / Sepsis Concern for Aspiration   P:   Assess cultures as above  Continue antibiotics for aspiration  ENDOCRINE A:   Hypothyroidism  Hyperglycemia    P:   Continue synthroid Monitor glucose on BMP   NEUROLOGIC A:   Myasthenia Gravis - on baseline prednisone 5mg  QD, followed by Dr. Jaynee Eagles  Plasmapheresis for MG flare P:   RASS goal: 0 to -1  Serial neuro exams  Neurology consult appreciated. Plasmapheresis session 1/5 today Propofol for sedation  Follow serial NIF, VC  FAMILY  - Updates: No family at bedside - Inter-disciplinary family meet or Palliative Care meeting due by: 8/16  The patient is critically ill with multiple organ system failure and requires high complexity decision making for assessment and support, frequent evaluation and titration of therapies, advanced monitoring, review of radiographic studies and interpretation of complex data.   Critical Care Time devoted to patient care services, exclusive of separately billable procedures, described in this note is 35 minutes.   Marshell Garfinkel MD [facility] Pager [phone] If no answer or after 3pm call: [phone] 10/31/2016, 9:26 AM

## 2016-10-31 NOTE — Progress Notes (Signed)
[place] Progress Note Patient Name: [name] DOB: 11-04-1930 MRN: [id number]   Date of Service  10/31/2016  HPI/Events of Note  K+ = 3.1 and Creatinine = 0.73.  eICU Interventions  Will replace K+.      Intervention Category Major Interventions: Electrolyte abnormality - evaluation and management  Sommer,Steven Eugene 10/31/2016, 8:29 PM

## 2016-10-31 NOTE — Progress Notes (Signed)
eLink Physician-Brief Progress Note Patient Name: [name] DOB: 07-07-1930 MRN: [id number]   Date of Service  10/31/2016  HPI/Events of Note  Hypotension - BP = 104/44 after sedation decreased.   eICU Interventions  Will order: 1. Bolus with 0.9 NaCl 500 mL IV over 30 minutes now. 2. Monitor CVP.     Intervention Category Major Interventions: Hypotension - evaluation and management  Sommer,Steven Eugene 10/31/2016, 4:25 PM

## 2016-11-01 ENCOUNTER — Inpatient Hospital Stay (HOSPITAL_COMMUNITY): Payer: Medicare Other

## 2016-11-01 ENCOUNTER — Encounter (HOSPITAL_COMMUNITY): Payer: Self-pay

## 2016-11-01 DIAGNOSIS — I361 Nonrheumatic tricuspid (valve) insufficiency: Secondary | ICD-10-CM

## 2016-11-01 LAB — ECHOCARDIOGRAM COMPLETE
AOASC: 29 cm
CHL CUP MV DEC (S): 194
CHL CUP RV SYS PRESS: 48 mmHg
EWDT: 194 ms
FS: 42 % (ref 28–44)
Height: 61 in
IV/PV OW: 1.18
LA ID, A-P, ES: 35 mm
LA diam end sys: 35 mm
LA diam index: 2.1 cm/m2
LA vol index: 23.6 mL/m2
LAVOL: 39.2 mL
LAVOLA4C: 28.8 mL
LDCA: 2.54 cm2
LV TDI E'LATERAL: 10
LV e' LATERAL: 10 cm/s
LVOTD: 18 mm
Lateral S' vel: 8.7 cm/s
MVPKEVEL: 0.8 m/s
PW: 11 mm — AB (ref 0.6–1.1)
RV TAPSE: 22.6 mm
Reg peak vel: 286 cm/s
TDI e' medial: 8.16
TRMAXVEL: 286 cm/s
Weight: 2292.78 oz

## 2016-11-01 LAB — BASIC METABOLIC PANEL
Anion gap: 5 (ref 5–15)
Anion gap: 9 (ref 5–15)
BUN: 8 mg/dL (ref 6–20)
BUN: 8 mg/dL (ref 6–20)
CALCIUM: 7.8 mg/dL — AB (ref 8.9–10.3)
CALCIUM: 8.1 mg/dL — AB (ref 8.9–10.3)
CHLORIDE: 119 mmol/L — AB (ref 101–111)
CO2: 18 mmol/L — ABNORMAL LOW (ref 22–32)
CO2: 17 mmol/L — ABNORMAL LOW (ref 22–32)
CREATININE: 0.69 mg/dL (ref 0.44–1.00)
CREATININE: 0.75 mg/dL (ref 0.44–1.00)
Chloride: 120 mmol/L — ABNORMAL HIGH (ref 101–111)
GFR calc Af Amer: >60 mL/min (ref >60)
GLUCOSE: 80 mg/dL (ref 65–99)
Glucose, Bld: 103 mg/dL — ABNORMAL HIGH (ref 65–99)
Potassium: 6 mmol/L — ABNORMAL HIGH (ref 3.5–5.1)
Potassium: 3.9 mmol/L (ref 3.5–5.1)
Sodium: 143 mmol/L (ref 135–145)
Sodium: 145 mmol/L (ref 135–145)

## 2016-11-01 LAB — CBC
HCT: 31.2 % — ABNORMAL LOW (ref 36.0–46.0)
Hemoglobin: 10.2 g/dL — ABNORMAL LOW (ref 12.0–15.0)
MCH: 30.9 pg (ref 26.0–34.0)
MCHC: 32.7 g/dL (ref 30.0–36.0)
MCV: 94.5 fL (ref 78.0–100.0)
PLATELETS: 124 10*3/uL — AB (ref 150–400)
RBC: 3.3 MIL/uL — ABNORMAL LOW (ref 3.87–5.11)
RDW: 14.9 % (ref 11.5–15.5)
WBC: 8.8 10*3/uL (ref 4.0–10.5)

## 2016-11-01 LAB — GLUCOSE, CAPILLARY
GLUCOSE-CAPILLARY: 102 mg/dL — AB (ref 65–99)
GLUCOSE-CAPILLARY: 121 mg/dL — AB (ref 65–99)
GLUCOSE-CAPILLARY: 70 mg/dL (ref 65–99)
GLUCOSE-CAPILLARY: 73 mg/dL (ref 65–99)
GLUCOSE-CAPILLARY: 78 mg/dL (ref 65–99)
Glucose-Capillary: 80 mg/dL (ref 65–99)
Glucose-Capillary: 84 mg/dL (ref 65–99)
Glucose-Capillary: 65 mg/dL (ref 65–99)
Glucose-Capillary: 109 mg/dL — ABNORMAL HIGH (ref 65–99)

## 2016-11-01 LAB — POCT I-STAT, CHEM 8
BUN: 7 mg/dL (ref 6–20)
CHLORIDE: 117 mmol/L — AB (ref 101–111)
CREATININE: 0.5 mg/dL (ref 0.44–1.00)
Calcium, Ion: 1.21 mmol/L (ref 1.15–1.40)
GLUCOSE: 77 mg/dL (ref 65–99)
HCT: 28 % — ABNORMAL LOW (ref 36.0–46.0)
Hemoglobin: 9.5 g/dL — ABNORMAL LOW (ref 12.0–15.0)
POTASSIUM: 3.4 mmol/L — AB (ref 3.5–5.1)
Sodium: 147 mmol/L — ABNORMAL HIGH (ref 135–145)
TCO2: 16 mmol/L (ref 0–100)

## 2016-11-01 LAB — PHOSPHORUS
Phosphorus: 1.7 mg/dL — ABNORMAL LOW (ref 2.5–4.6)
Phosphorus: 1.6 mg/dL — ABNORMAL LOW (ref 2.5–4.6)

## 2016-11-01 LAB — MAGNESIUM
MAGNESIUM: 2 mg/dL (ref 1.7–2.4)
Magnesium: 1.7 mg/dL (ref 1.7–2.4)

## 2016-11-01 LAB — TRIGLYCERIDES: Triglycerides: 59 mg/dL (ref <150)

## 2016-11-01 MED ORDER — ACD FORMULA A 0.73-2.45-2.2 GM/100ML VI SOLN
Status: AC
  Administered 2016-11-01: 500 mL
  Filled 2016-11-01: qty 1000

## 2016-11-01 MED ORDER — SODIUM CHLORIDE 0.9 % IV SOLN
2.0000 g | Freq: Once | INTRAVENOUS | Status: AC
  Administered 2016-11-01: 2 g via INTRAVENOUS
  Filled 2016-11-01: qty 20

## 2016-11-01 MED ORDER — CALCIUM GLUCONATE 10 % IV SOLN
2.0000 g | Freq: Once | INTRAVENOUS | Status: DC
  Filled 2016-11-01: qty 20

## 2016-11-01 MED ORDER — ACETAMINOPHEN 325 MG PO TABS: 650.0000 mg | ORAL_TABLET | ORAL | Status: DC | PRN

## 2016-11-01 MED ORDER — SODIUM CHLORIDE 0.9 % IV SOLN
INTRAVENOUS | Status: AC
  Administered 2016-11-01 (×4): via INTRAVENOUS_CENTRAL
  Filled 2016-11-01 (×4): qty 200

## 2016-11-01 MED ORDER — ACD FORMULA A 0.73-2.45-2.2 GM/100ML VI SOLN
500.0000 mL | Status: DC
  Administered 2016-11-01: 500 mL via INTRAVENOUS
  Filled 2016-11-01 (×2): qty 500

## 2016-11-01 MED ORDER — PRO-STAT SUGAR FREE PO LIQD
30.0000 mL | Freq: Two times a day (BID) | ORAL | Status: DC
  Administered 2016-11-01 – 2016-11-04 (×7): 30 mL
  Filled 2016-11-01 (×7): qty 30

## 2016-11-01 MED ORDER — VITAL AF 1.2 CAL PO LIQD
1000.0000 mL | ORAL | Status: DC
  Administered 2016-11-01 – 2016-11-03 (×3): 1000 mL

## 2016-11-01 MED ORDER — PREDNISONE 20 MG PO TABS
20.0000 mg | ORAL_TABLET | Freq: Every day | ORAL | Status: DC
  Administered 2016-11-02 – 2016-11-07 (×6): 20 mg
  Filled 2016-11-01 (×7): qty 1

## 2016-11-01 MED ORDER — HEPARIN SODIUM (PORCINE) 1000 UNIT/ML IJ SOLN: 1000.0000 [IU] | Freq: Once | INTRAMUSCULAR | Status: DC

## 2016-11-01 MED ORDER — DEXTROSE 50 % IV SOLN
25.0000 mL | Freq: Once | INTRAVENOUS | Status: AC
  Administered 2016-11-01: 25 mL via INTRAVENOUS
  Filled 2016-11-01: qty 50

## 2016-11-01 MED ORDER — DIPHENHYDRAMINE HCL 25 MG PO CAPS: 25.0000 mg | ORAL_CAPSULE | Freq: Four times a day (QID) | ORAL | Status: DC | PRN

## 2016-11-01 NOTE — Plan of Care (Signed)
Problem: Pain Managment: Goal: General experience of comfort will improve Outcome: Progressing Pt comfortable, on propofol, resting in bed.

## 2016-11-01 NOTE — Progress Notes (Signed)
PULMONARY / CRITICAL CARE MEDICINE   Name: [name] MRN: [id number] DOB: June 20, 1930    ADMISSION DATE:  10/29/2016 CONSULTATION DATE:  10/29/16  REFERRING MD:  Dr. Laverta [place] / EDP   CHIEF COMPLAINT:  SOB  HISTORY OF PRESENT ILLNESS:   83 y/o F who presented to [facility] on 8/7 via EMS from a SNF with reports of increasing shortness of breath and confusion.  Hx Myasthenia Gravis (baseline prednisone 5mg , followed by Dr. Jaynee Eagles) and dementia > lives in a memory care unit.   Per reports, the patient has been short of breath since 8/6.  She was combative and hypoxic with EMS who apparently attempted nasal intubation which was unsuccessful. Saturations were in the 70's on NRB.  She also vomited in route with concern for possible aspiration.  BVM ventilation provided until arrival to ER. On arrival to ER, she unresponsive and was intubated for airway protection. Initial labs - Na 141, K 3.8, Cl 107, glucose 193, BUN 15, sr cr 0.72, AG 12, troponin 0.0, lactic acid 2.61, WBC 13.7, Hgb 15, platelets 258.  CXR demonstrated bibasilar fibrosis. Attempts to reach family unsuccessful per ER.   PCCM consulted for ICU admission.   PAST MEDICAL HISTORY :  She  has a past medical history of Arthritis; Diverticulosis of colon; GERD (gastroesophageal reflux disease); H/O hiatal hernia; Hemorrhoid; Macular degeneration; Osteopenia; Seizures ([place]); and Thyroid disease.  PAST SURGICAL HISTORY: She  has a past surgical history that includes Colonoscopy; Carpal tunnel release; Foot surgery; Joint replacement (1990 / 2011); Laser ablation; Femur fracture surgery; Breast surgery; Eye surgery; and Cholecystectomy (04/30/2012).  Allergies  Allergen Reactions  . Ivp Dye [Iodinated Diagnostic Agents] Shortness Of Breath  . Metrizamide Shortness Of Breath  . Red Dye     No current facility-administered medications on file prior to encounter.    Current Outpatient Prescriptions on File Prior to Encounter   Medication Sig  . acetaminophen (TYLENOL) 500 MG tablet Take 500 mg by mouth 3 (three) times daily.   Marland Kitchen alendronate (FOSAMAX) 70 MG tablet Take 70 mg by mouth once a week. Take with a full glass of water on an empty stomach.  . cycloSPORINE (RESTASIS) 0.05 % ophthalmic emulsion Place 1 drop into both eyes 2 (two) times daily.  . furosemide (LASIX) 20 MG tablet Take 20 mg by mouth daily.  Marland Kitchen levothyroxine (SYNTHROID, LEVOTHROID) 75 MCG tablet Take 75 mcg by mouth every morning.  Marland Kitchen LORazepam (ATIVAN) 0.5 MG tablet Take 0.5 mg by mouth at bedtime.   . Multiple Vitamins-Minerals (PRESERVISION AREDS 2) CAPS Take 1 capsule by mouth daily.   . Probiotic Product (ALIGN) 4 MG CAPS Take 1 capsule by mouth daily.   Marland Kitchen pyridOXINE (VITAMIN B-6) 100 MG tablet Take 100 mg by mouth 2 (two) times daily.   . Vitamin D, Ergocalciferol, (DRISDOL) 50000 UNITS CAPS Take 50,000 Units by mouth every 7 (seven) days.     FAMILY HISTORY:  Her indicated that her mother is deceased. She indicated that her father is deceased. She indicated that her sister is alive. She indicated that her brother is alive.    SOCIAL HISTORY: She  reports that she has never smoked. She has never used smokeless tobacco. She reports that she does not drink alcohol or use drugs.  REVIEW OF SYSTEMS:  Unable to complete as patient is on mechanical ventilation.   SUBJECTIVE: Tolerating plasmapheresis Low BP overnight. Given fluids  VITAL SIGNS: BP (!) 105/51   Pulse 80  Temp 98.5 F (36.9 C) (Axillary)   Resp 18   Ht 5\' 1"  (1.549 m)   Wt 139 lb 1.8 oz (63.1 kg)   SpO2 98%   BMI 26.28 kg/m   HEMODYNAMICS: CVP:  [5 mmHg-8 mmHg] 8 mmHg  VENTILATOR SETTINGS: Vent Mode: PRVC FiO2 (%):  [40 %] 40 % Set Rate:  [18 bmp] 18 bmp Vt Set:  [500 mL] 500 mL PEEP:  [5 cmH20] 5 cmH20 Plateau Pressure:  [19 cmH20-21 cmH20] 19 cmH20  INTAKE / OUTPUT: I/O last 3 completed shifts: In: 3303 [I.V.:2403; IV Piggyback:900] Out: 4656  [Urine:3405; Emesis/NG output:570]  PHYSICAL EXAMINATION:. Gen:      No acute distress HEENT:  EOMI, sclera anicteric, ETT in place Neck:     No masses; no thyromegaly Lungs:    Clear to auscultation bilaterally; normal respiratory effort CV:         Regular rate and rhythm; no murmurs Abd:      + bowel sounds; soft, non-tender; no palpable masses, no distension Ext:    No edema; adequate peripheral perfusion Skin:      Warm and dry; no rash Neuro: Somnolent, follows commands  LABS:  BMET  Recent Labs Lab 10/31/16 1843 11/01/16 0310 11/01/16 0532  NA 146* 143 145  K 3.1* 6.0* 3.9  CL 119* 120* 119*  CO2 18* 18* 17*  BUN 8 8 8   CREATININE 0.73 0.69 0.75  GLUCOSE 93 80 103*    Electrolytes  Recent Labs Lab 10/30/16 0524 10/31/16 0341 10/31/16 1843 11/01/16 0310 11/01/16 0532  CALCIUM 7.7* 8.0* 7.7* 7.8* 8.1*  MG 1.7 2.1  --  2.0  --   PHOS 2.8 2.4*  --  1.7*  --     CBC  Recent Labs Lab 10/30/16 0524 10/31/16 0341 11/01/16 0310  WBC 14.8* 10.2 8.8  HGB 11.2* 11.0* 10.2*  HCT 34.1* 34.0* 31.2*  PLT 197 146* 124*    Coag's No results for input(s): APTT, INR in the last 168 hours.  Sepsis Markers  Recent Labs Lab 10/29/16 2106 10/30/16 0257 10/30/16 0524 10/31/16 0341  LATICACIDVEN 3.2* 3.0* 2.1*  --   PROCALCITON 10.66  --  12.47 6.78    ABG  Recent Labs Lab 10/29/16 1103 10/30/16 0400 10/31/16 0332  PHART 7.332* 7.480* 7.496*  PCO2ART 48.3* 29.3* 27.8*  PO2ART 61.0* 83.8 58.5*    Liver Enzymes No results for input(s): AST, ALT, ALKPHOS, BILITOT, ALBUMIN in the last 168 hours.  Cardiac Enzymes  Recent Labs Lab 10/31/16 0944 10/31/16 1843 10/31/16 2047  TROPONINI 0.06* 0.05* 0.05*    Glucose  Recent Labs Lab 10/31/16 1603 10/31/16 1941 11/01/16 0007 11/01/16 0143 11/01/16 0335 11/01/16 0802  GLUCAP 96 77 70 80 73 78   Imaging No results found.  STUDIES:   CULTURES: Sputum 8/7 >>  BCx2 8/7 >>  UC 8/7 >>    ANTIBIOTICS: Vanco 8/7 >> 8/10 Zosyn 8/7 >>   SIGNIFICANT EVENTS: 8/07  Admit with SOB, intubated   LINES/TUBES: ETT 8/7 >>  Rt IJ HD cath 8/8 >>  DISCUSSION: 83 y/o F with reported MG on prednisone and advanced dementia (per report, lives in a memory care unit) who was admitted 8/7 with increased SOB.  EMS attempted intubation nasally > pt vomited, concern for aspiration.  Intubated in ER.  Started plasmapheresis 8/9 for myasthenia flare  ASSESSMENT / PLAN:  PULMONARY A: Acute Hypoxic Respiratory Failure - concern for aspiration (chronic vs acute with intubation attempt), note no  hypercarbia  Suspected Aspiration P:   Continue vent support Aspiration precautions  Follow NIF / VC  CARDIOVASCULAR A:  SIRS / Sepsis - suspected aspiration in setting of dementia  Elevated troponin. ? demand P:  Follow echo Keep I/O even today Hold further lasix due to soft BP  RENAL A: Mild Elevation of Lactic Acid. Resolved P:   Trend BMP / urinary output Replace electrolytes as indicated Avoid nephrotoxic agents, ensure adequate renal perfusion  GASTROINTESTINAL A:   Nausea / Vomiting - suspect related to nasal intubation attempt  P:   Start tube feeds PPI for SUP   HEMATOLOGIC A:   Mild Leukocytosis  P:  Monitor CBC  Heparin for DVT prophylaxis   INFECTIOUS A:   SIRS / Sepsis Concern for Aspiration   P:   Assess cultures as above  Continue antibiotics for aspiration Stop vanco as MRSA swab is negative Continue zozyn day 4/7  ENDOCRINE A:   Hypothyroidism  Hyperglycemia    P:   Continue synthroid Monitor glucose on BMP   NEUROLOGIC A:   Myasthenia Gravis - on baseline prednisone 5mg  QD, followed by Dr. Jaynee Eagles  Plasmapheresis for MG flare P:   RASS goal: 0 to -1  Serial neuro exams  Neurology consult appreciated. Plasmapheresis session 2/5 today Propofol for sedation  Follow serial NIF, VC  FAMILY  - Updates: No family at bedside -  Inter-disciplinary family meet or Palliative Care meeting due by: 8/16  The patient is critically ill with multiple organ system failure and requires high complexity decision making for assessment and support, frequent evaluation and titration of therapies, advanced monitoring, review of radiographic studies and interpretation of complex data.   Critical Care Time devoted to patient care services, exclusive of separately billable procedures, described in this note is 35 minutes.   Marshell Garfinkel MD [facility] Pager [phone] If no answer or after 3pm call: [phone] 11/01/2016, 9:08 AM

## 2016-11-01 NOTE — Progress Notes (Signed)
NIF -12

## 2016-11-01 NOTE — Progress Notes (Signed)
Pharmacy Antibiotic Note  [name] is a 83 y.o. female admitted on 10/29/2016 with pneumonia. The patient was actively vomiting as EMS transported patient to the ED.  Pharmacy has been consulted for vancomycin and Zosyn dosing.  Plan: Vancomycin discontinued due to negative MRSA PCR. Continue Zosyn 3.375g IV q8h (4 hour infusion). Follow-up clinical progression  Height: 5\' 1"  (154.9 cm) Weight: 143 lb 4.8 oz (65 kg) IBW/kg (Calculated) : 47.8  Temp (24hrs), Avg:98.5 F (36.9 C), Min:97.5 F (36.4 C), Max:99.9 F (37.7 C)   Recent Labs Lab 10/29/16 1035 10/29/16 1043 10/29/16 2106 10/30/16 0257 10/30/16 0524 10/31/16 0341 10/31/16 1843 11/01/16 0310 11/01/16 0532  WBC 13.7*  --   --   --  14.8* 10.2  --  8.8  --   CREATININE 0.72  --  0.74  --  0.82 0.78 0.73 0.69 0.75  LATICACIDVEN  --  2.61* 3.2* 3.0* 2.1*  --   --   --   --     Estimated Creatinine Clearance: 45.2 mL/min (by C-G formula based on SCr of 0.75 mg/dL).    Allergies  Allergen Reactions  . Ivp Dye [Iodinated Diagnostic Agents] Shortness Of Breath  . Metrizamide Shortness Of Breath  . Red Dye     Antimicrobials this admission: 8/7 Vancomycin>>8/10 8/7 Zosyn>>  Dose adjustments this admission: N/A  Microbiology results: 8/7 BCx: no growth 2 days 8/7 UCx: no growth 8/7 MRSA PCR: negative   Thank you for allowing pharmacy to be a part of this patient's care.  Betsey Holiday 11/01/2016 10:28 AM

## 2016-11-01 NOTE — Progress Notes (Signed)
Nutrition Follow-up  INTERVENTION:   Start Vital AF 1.2 @ 35 ml/hr (840 ml/day) 30 ml Prostat BID Provides: 1208 kcal, 93 grams protein, and 681 ml free water.  TF regimen and propofol at current rate providing 1450 total kcal/day (99 % of kcal needs)  NUTRITION DIAGNOSIS:   Inadequate oral intake related to inability to eat as evidenced by NPO status. Ongoing.   GOAL:   Patient will meet greater than or equal to 90% of their needs Progressing.   MONITOR:   TF tolerance, I & O's  REASON FOR ASSESSMENT:   Consult Enteral/tube feeding initiation and management  ASSESSMENT:   Pt with PMH of dementia (resides at memory care unit at [facility]) and  myasthenia gravis on prednisone daily admitted with SOB. Nasotracheal intubation attempted but unsuccessful and resulted in emesis with possible aspiration PNA.   Pt discussed during ICU rounds and with RN.  Per RN pt has completed 2 of 5 plasma exchange, neuro following.   Patient is currently intubated on ventilator support MV: 12.4 L/min Temp (24hrs), Avg:98.8 F (37.1 C), Min:98.4 F (36.9 C), Max:99.9 F (37.7 C)  Propofol: 9.2 ml/hr provides: 242 kcal per day Medications reviewed and include: SSI, synthroid, prednisone, vitamin B6 IVF: LR @ 100 ml/hr Labs reviewed   Diet Order:  Diet NPO time specified  Skin:  Reviewed, no issues  Last BM:  unknown  Height:   Ht Readings from Last 1 Encounters:  10/31/16 5\' 1"  (1.549 m)    Weight:   Wt Readings from Last 1 Encounters:  11/01/16 143 lb 4.8 oz (65 kg)    Ideal Body Weight:  47.7 kg  BMI:  Body mass index is 27.08 kg/m.  Estimated Nutritional Needs:   Kcal:  1469  Protein:  75-90 grams  Fluid:  > 1.5 L/day  EDUCATION NEEDS:   No education needs identified at this time  [place], [place], [place] Pager [phone] After Hours Pager

## 2016-11-01 NOTE — Progress Notes (Signed)
Pulled back ETT 2CM to 19 at lip per MD order

## 2016-11-01 NOTE — Progress Notes (Signed)
  Echocardiogram 2D Echocardiogram has been performed.  [name] 11/01/2016, 12:42 PM

## 2016-11-01 NOTE — Plan of Care (Signed)
Problem: Coping: Goal: Level of anxiety will decrease Outcome: Progressing Pt able to be redirected when agitated/anxious, titrated propofol to keep pt comfortable and decrease agitation.  Problem: Nutritional: Goal: Intake of prescribed amount of daily calories will improve Outcome: Progressing Tube feeds started 8/10  Problem: Role Relationship: Goal: Method of communication will improve Outcome: Not Progressing Pt has difficulty communicating most needs, attempts to use hand signals but staff unable to understand.   Problem: Skin Integrity Impairment Risk: Goal: Risk for impaired skin integrity will decrease Outcome: Progressing Turn pt q2hrs, SCDs, heels off bed, sacral foam, low air loss mattress.

## 2016-11-01 NOTE — Progress Notes (Signed)
Subjective: Reports possible improvement in her double vision.   Exam: Vitals:   11/01/16 1000 11/01/16 1015  BP:    Pulse:    Resp:    Temp: 98.6 F (37 C) 98.6 F (37 C)  SpO2:     Gen: In bed, NAD Resp: non-labored breathing, no acute distress Abd: soft, nt  Neuro: MS: Awake, alert, answers questions readily CN: Endorses double vision, but no clear disconjugate gaze, versions appear full Motor: good peripheral strength  Sensory: Intact light touch  Impression: 83 year old female with myasthenia gravis is presenting with respiratory failure. Initial presentation was unclear, but given the progressive dysphagia, I suspect that she may have had an aspiration event secondary to myasthenic flare causing a mixed picture of her respiratory failure. She has been started on PLEX  Recommendations: 1) plasma exchange, Tx 2/5 today, 3/5 tomorrow, then QOD 2) increase prednisone to 20mg  daily.  3) will follow   Roland Rack, MD [facility] [phone]  If 7pm- 7am, please page neurology on call as listed in [place].

## 2016-11-02 ENCOUNTER — Inpatient Hospital Stay (HOSPITAL_COMMUNITY): Payer: Medicare Other

## 2016-11-02 DIAGNOSIS — G7001 Myasthenia gravis with (acute) exacerbation: Secondary | ICD-10-CM

## 2016-11-02 LAB — GLUCOSE, CAPILLARY
GLUCOSE-CAPILLARY: 109 mg/dL — AB (ref 65–99)
Glucose-Capillary: 100 mg/dL — ABNORMAL HIGH (ref 65–99)
Glucose-Capillary: 140 mg/dL — ABNORMAL HIGH (ref 65–99)
Glucose-Capillary: 147 mg/dL — ABNORMAL HIGH (ref 65–99)
Glucose-Capillary: 136 mg/dL — ABNORMAL HIGH (ref 65–99)

## 2016-11-02 LAB — BASIC METABOLIC PANEL
ANION GAP: 8 (ref 5–15)
BUN: 9 mg/dL (ref 6–20)
CALCIUM: 8.4 mg/dL — AB (ref 8.9–10.3)
CO2: 16 mmol/L — ABNORMAL LOW (ref 22–32)
CREATININE: 0.6 mg/dL (ref 0.44–1.00)
Chloride: 123 mmol/L — ABNORMAL HIGH (ref 101–111)
GLUCOSE: 105 mg/dL — AB (ref 65–99)
Potassium: 3.1 mmol/L — ABNORMAL LOW (ref 3.5–5.1)
Sodium: 147 mmol/L — ABNORMAL HIGH (ref 135–145)

## 2016-11-02 LAB — CBC
HCT: 32.6 % — ABNORMAL LOW (ref 36.0–46.0)
HEMOGLOBIN: 10.4 g/dL — AB (ref 12.0–15.0)
MCH: 30.2 pg (ref 26.0–34.0)
MCHC: 31.9 g/dL (ref 30.0–36.0)
MCV: 94.8 fL (ref 78.0–100.0)
Platelets: 130 10*3/uL — ABNORMAL LOW (ref 150–400)
RBC: 3.44 MIL/uL — AB (ref 3.87–5.11)
RDW: 15.2 % (ref 11.5–15.5)
WBC: 7.9 10*3/uL (ref 4.0–10.5)

## 2016-11-02 LAB — PHOSPHORUS
PHOSPHORUS: 3.1 mg/dL (ref 2.5–4.6)
Phosphorus: 2 mg/dL — ABNORMAL LOW (ref 2.5–4.6)

## 2016-11-02 LAB — MAGNESIUM
Magnesium: 1.9 mg/dL (ref 1.7–2.4)
Magnesium: 2 mg/dL (ref 1.7–2.4)

## 2016-11-02 MED ORDER — POTASSIUM PHOSPHATE MONOBASIC 500 MG PO TABS
500.0000 mg | ORAL_TABLET | Freq: Three times a day (TID) | ORAL | Status: DC
  Filled 2016-11-02 (×2): qty 1

## 2016-11-02 MED ORDER — ORAL CARE MOUTH RINSE
15.0000 mL | OROMUCOSAL | Status: DC
  Administered 2016-11-02 – 2016-11-05 (×31): 15 mL via OROMUCOSAL

## 2016-11-02 MED ORDER — BENZOCAINE 10 % MT GEL
Freq: Four times a day (QID) | OROMUCOSAL | Status: DC | PRN
  Administered 2016-11-02 (×2): via OROMUCOSAL
  Filled 2016-11-02 (×2): qty 9

## 2016-11-02 MED ORDER — CALCIUM CARBONATE ANTACID 500 MG PO CHEW
2.0000 | CHEWABLE_TABLET | ORAL | Status: AC
  Administered 2016-11-02: 400 mg via ORAL
  Filled 2016-11-02: qty 2

## 2016-11-02 MED ORDER — ACD FORMULA A 0.73-2.45-2.2 GM/100ML VI SOLN
500.0000 mL | Status: DC
  Administered 2016-11-02 (×2): 500 mL via INTRAVENOUS
  Filled 2016-11-02 (×2): qty 500

## 2016-11-02 MED ORDER — DIPHENHYDRAMINE HCL 25 MG PO CAPS: 25.0000 mg | ORAL_CAPSULE | Freq: Four times a day (QID) | ORAL | Status: DC | PRN

## 2016-11-02 MED ORDER — HEPARIN SODIUM (PORCINE) 1000 UNIT/ML IJ SOLN: 1000.0000 [IU] | Freq: Once | INTRAMUSCULAR | Status: DC

## 2016-11-02 MED ORDER — POTASSIUM CHLORIDE 20 MEQ/15ML (10%) PO SOLN
40.0000 meq | Freq: Once | ORAL | Status: AC
  Administered 2016-11-02: 40 meq
  Filled 2016-11-02: qty 30

## 2016-11-02 MED ORDER — SODIUM CHLORIDE 0.9 % IV SOLN
INTRAVENOUS | Status: AC
  Administered 2016-11-02 (×4): via INTRAVENOUS_CENTRAL
  Filled 2016-11-02 (×4): qty 200

## 2016-11-02 MED ORDER — K PHOS MONO-SOD PHOS DI & MONO 155-852-130 MG PO TABS
500.0000 mg | ORAL_TABLET | Freq: Three times a day (TID) | ORAL | Status: AC
  Administered 2016-11-02 (×2): 500 mg via ORAL
  Filled 2016-11-02 (×3): qty 2

## 2016-11-02 MED ORDER — ACETAMINOPHEN 325 MG PO TABS: 650.0000 mg | ORAL_TABLET | ORAL | Status: DC | PRN

## 2016-11-02 MED ORDER — SODIUM CHLORIDE 0.9 % IV SOLN
2.0000 g | Freq: Once | INTRAVENOUS | Status: AC
  Administered 2016-11-02: 2 g via INTRAVENOUS
  Filled 2016-11-02: qty 20

## 2016-11-02 MED ORDER — ACD FORMULA A 0.73-2.45-2.2 GM/100ML VI SOLN
Status: AC
  Filled 2016-11-02: qty 1000

## 2016-11-02 NOTE — Progress Notes (Signed)
While giving pt a bath she had 2 issues of throwing up what appeared to be green bile substance. Tube feeding turned off.

## 2016-11-02 NOTE — Progress Notes (Signed)
PULMONARY / CRITICAL CARE MEDICINE   Name: [name] MRN: [id number] DOB: [birth year]    ADMISSION DATE:  10/29/2016 CONSULTATION DATE:  10/29/16  REFERRING MD:  Dr. Laverta Baltimore / EDP   CHIEF COMPLAINT:  SOB  HISTORY OF PRESENT ILLNESS:   [name] who presented to [facility] on 8/7 via EMS from a SNF with reports of increasing shortness of breath and confusion.  Hx Myasthenia Gravis (baseline prednisone 5mg , followed by Dr. Jaynee Eagles) and dementia > lives in a memory care unit.   She was combative and hypoxic with EMS who apparently attempted nasal intubation which was unsuccessful. Saturations were in the 70's on NRB.  She also vomited in route with concern for possible aspiration.  BVM ventilation provided until arrival to ER. On arrival to ER, she unresponsive and was intubated for airway protection.   STUdies  Echo 8/10 nml EF, RVSP 48  CULTURES: Sputum 8/7 >>  BCx2 8/7 >> ng UC 8/7 >> ng  ANTIBIOTICS: Vanco 8/7 >> 8/10 Zosyn 8/7 >>   SIGNIFICANT EVENTS: 8/07  Admit with SOB, intubated   LINES/TUBES: ETT 8/7 >>  Rt IJ HD cath 8/8 >>  SUBJECTIVE: Undergoing plasmapheresis Critically ill, intubated NIF remains low  VITAL SIGNS: BP (!) 107/44   Pulse 85   Temp 98.7 Walker (37.1 C) (Axillary)   Resp 20   Ht 5\' 1"  (1.549 m)   Wt 147 lb 7.8 oz (66.9 kg)   SpO2 98%   BMI 27.87 kg/m   HEMODYNAMICS: CVP:  [2 mmHg-33 mmHg] 7 mmHg  VENTILATOR SETTINGS: Vent Mode: PRVC FiO2 (%):  [40 %] 40 % Set Rate:  [18 bmp] 18 bmp Vt Set:  [500 mL] 500 mL PEEP:  [5 cmH20] 5 cmH20 Plateau Pressure:  [18 cmH20-26 cmH20] 18 cmH20  INTAKE / OUTPUT: I/O last 3 completed shifts: In: 1231 [I.V.:694.7; NG/GT:386.3; IV Piggyback:150] Out: 2105 [Urine:1805; Emesis/NG output:300]  PHYSICAL EXAMINATION:. Gen:      Elderly,No acute distress HEENT:  EOMI, sclera anicteric, ETT in place Neck:     No masses; no thyromegaly Lungs:    Clear to auscultation bilaterally; normal  respiratory effort CV:         Regular rate and rhythm; no murmurs Abd:      + bowel sounds; soft, non-tender; no palpable masses, no distension Ext:    No edema; adequate peripheral perfusion Skin:      Warm and dry; no rash Neuro: sedated on propofol gtt, follows commands when awake  LABS:  BMET  Recent Labs Lab 11/01/16 0310 11/01/16 0532 11/01/16 0822 11/02/16 0534  NA 143 145 147* 147*  K 6.0* 3.9 3.4* 3.1*  CL 120* 119* 117* 123*  CO2 18* 17*  --  16*  BUN 8 8 7 9   CREATININE 0.69 0.75 0.50 0.Meredith  GLUCOSE 80 103* 77 105*    Electrolytes  Recent Labs Lab 11/01/16 0310 11/01/16 0532 11/01/16 1643 11/02/16 0534  CALCIUM 7.8* 8.1*  --  8.4*  MG 2.0  --  1.7 1.9  PHOS 1.7*  --  1.6* 2.0*    CBC  Recent Labs Lab 10/31/16 0341 11/01/16 0310 11/01/16 0822 11/02/16 0534  WBC 10.2 8.8  --  7.9  HGB 11.0* 10.2* 9.5* 10.4*  HCT 34.0* 31.2* 28.0* 32.6*  PLT 146* 124*  --  130*    Coag's No results for input(s): APTT, INR in the last 168 hours.  Sepsis Markers  Recent Labs Lab 10/29/16 2106  10/30/16 0257 10/30/16 0524 10/31/16 0341  LATICACIDVEN 3.2* 3.0* 2.1*  --   PROCALCITON 10.66  --  12.47 6.78    ABG  Recent Labs Lab 10/29/16 1103 10/30/16 0400 10/31/16 0332  PHART 7.332* 7.480* 7.496*  PCO2ART 48.3* 29.3* 27.8*  PO2ART 61.0* 83.8 58.5*    Liver Enzymes No results for input(s): AST, ALT, ALKPHOS, BILITOT, ALBUMIN in the last 168 hours.  Cardiac Enzymes  Recent Labs Lab 10/31/16 0944 10/31/16 1843 10/31/16 2047  TROPONINI 0.06* 0.05* 0.05*    Glucose  Recent Labs Lab 11/01/16 1546 11/01/16 1656 11/01/16 1959 11/01/16 2319 11/02/16 0323 11/02/16 0749  GLUCAP 65 109* 121* 102* 100* 109*   Imaging Dg Chest Port 1 View  Result Date: 11/02/2016 CLINICAL DATA:  Acute respiratory failure. EXAM: PORTABLE CHEST 1 VIEW COMPARISON:  11/01/2016 and 10/31/2016 FINDINGS: Endotracheal tube has tip 1.6 cm above the carina.  Nasogastric tube and right IJ central venous catheter unchanged. Lungs are hypoinflated with stable bilateral central opacification. Mild stable bibasilar opacification. Stable cardiomegaly. Mild calcified plaque over the aortic arch. Remainder of the exam is unchanged. IMPRESSION: Stable bilateral central lung opacification which may be due to edema versus infection. Stable mild bibasilar opacification likely small effusions/atelectasis. Mild stable cardiomegaly. Tubes and lines as described. Aortic Atherosclerosis (ICD10-I70.0). Electronically Signed   By: Marin Olp M.D.   On: 11/02/2016 08:09  reviewed  STUDIES:     DISCUSSION: [name] with reported MG on prednisone and advanced dementia (per report, lives in a memory care unit) who was admitted 8/7 with increased SOB.  EMS attempted intubation nasally > pt vomited, concern for aspiration.  Intubated in ER.  Started plasmapheresis 8/9 for myasthenia flare  ASSESSMENT / PLAN:  PULMONARY A: Acute Hypoxic Respiratory Failure - concern for aspiration (chronic vs acute with intubation attempt), note no hypercarbia  Suspected Aspiration P:   Continue vent support Follow NIF / VC daily, remains low  CARDIOVASCULAR A:  SIRS / Sepsis - suspected aspiration in setting of dementia  Demand ischemia, peak Tp 0.06 P: Hold further lasix due to soft BP Keep I/O even    RENAL A: Mild Elevation of Lactic Acid. Resolved Hypokalemia hypernatremia P:   Add free water Trend BMP / urinary output Replace electrolytes as indicated   GASTROINTESTINAL A:   Nausea / Vomiting - suspect related to nasal intubation attempt  P:   ct tube feeds PPI for SUP   HEMATOLOGIC A:   Mild Leukocytosis  P:  Monitor CBC  Heparin for DVT prophylaxis   INFECTIOUS A:   SIRS / Sepsis  Aspiration  pneumonia P:   Continue zozyn day 5/7  ENDOCRINE A:   Hypothyroidism  Hyperglycemia    P:   Continue synthroid Monitor glucose on BMP    NEUROLOGIC A:   Myasthenia Gravis - on baseline prednisone 5mg  QD, followed by Dr. Jaynee Eagles  Plasmapheresis for MG flare P:   RASS goal: 0 to -1  Serial neuro exams  Neurology consult appreciated. Plasmapheresis session 3/5 today Propofol for sedation  Follow serial NIF, VC  FAMILY  - Updates: No family at bedside - Inter-disciplinary family meet or Palliative Care meeting due by: 8/16  The patient is critically ill with multiple organ system failure and requires high complexity decision making for assessment and support, frequent evaluation and titration of therapies, advanced monitoring, review of radiographic studies and interpretation of complex data.   Critical Care Time devoted to patient care services, exclusive of separately  billable procedures, described in this note is 31 minutes.   Kara Mead MD. [place]. [facility] Pager [phone] If no response call [phone]   11/02/2016   11/02/2016, 11:30 AM

## 2016-11-02 NOTE — Progress Notes (Signed)
Subjective: No significant change  Exam: Vitals:   11/02/16 1700 11/02/16 1800  BP: (!) 114/50 117/72  Pulse: 78 70  Resp: 18 18  Temp:    SpO2: 98% 98%   Gen: In bed, NAD Resp: non-labored breathing, no acute distress Abd: soft, nt  Neuro: MS: Awake, alert, answers questions And follows commands readily CN:  no clear disconjugate gaze, versions appear full Motor: good peripheral strength  Sensory: Intact light touch  Impression: 83 year old female with myasthenia gravis is presenting with respiratory failure. Initial presentation was unclear, but given the progressive dysphagia, I suspect that she may have had an aspiration event secondary to myasthenic flare causing a mixed picture of her respiratory failure. She has been started on PLEX  Recommendations: 1) plasma exchange, Tx 2/5 today, 3/5 today, then QOD 2) prednisone  20mg  daily.(Increased from home dose of 5 mg daily) 3) will follow   Roland Rack, MD [facility] [phone]  If 7pm- 7am, please page neurology on call as listed in [place].

## 2016-11-02 NOTE — Progress Notes (Signed)
RT attempted to obtain NIF/VC on patient. Patient is unable to perform at this time.

## 2016-11-02 NOTE — Progress Notes (Signed)
RT attempted NIF with pt and poor pt effort due to sedation and drowsiness. Rt to cont to monitor.

## 2016-11-03 ENCOUNTER — Inpatient Hospital Stay (HOSPITAL_COMMUNITY): Payer: Medicare Other

## 2016-11-03 LAB — CBC
HCT: 29.4 % — ABNORMAL LOW (ref 36.0–46.0)
Hemoglobin: 9.8 g/dL — ABNORMAL LOW (ref 12.0–15.0)
MCH: 31.1 pg (ref 26.0–34.0)
MCHC: 33.3 g/dL (ref 30.0–36.0)
MCV: 93.3 fL (ref 78.0–100.0)
Platelets: 155 10*3/uL (ref 150–400)
RBC: 3.15 MIL/uL — ABNORMAL LOW (ref 3.87–5.11)
RDW: 15.3 % (ref 11.5–15.5)
WBC: 7.5 10*3/uL (ref 4.0–10.5)

## 2016-11-03 LAB — BASIC METABOLIC PANEL
Anion gap: 5 (ref 5–15)
BUN: 12 mg/dL (ref 6–20)
CALCIUM: 8.7 mg/dL — AB (ref 8.9–10.3)
CO2: 19 mmol/L — ABNORMAL LOW (ref 22–32)
CREATININE: 0.54 mg/dL (ref 0.44–1.00)
Chloride: 126 mmol/L — ABNORMAL HIGH (ref 101–111)
GFR calc non Af Amer: >60 mL/min (ref >60)
Glucose, Bld: 109 mg/dL — ABNORMAL HIGH (ref 65–99)
Potassium: 3.1 mmol/L — ABNORMAL LOW (ref 3.5–5.1)
SODIUM: 150 mmol/L — AB (ref 135–145)

## 2016-11-03 LAB — GLUCOSE, CAPILLARY
GLUCOSE-CAPILLARY: 112 mg/dL — AB (ref 65–99)
GLUCOSE-CAPILLARY: 114 mg/dL — AB (ref 65–99)
GLUCOSE-CAPILLARY: 118 mg/dL — AB (ref 65–99)
GLUCOSE-CAPILLARY: 129 mg/dL — AB (ref 65–99)
Glucose-Capillary: 106 mg/dL — ABNORMAL HIGH (ref 65–99)
Glucose-Capillary: 136 mg/dL — ABNORMAL HIGH (ref 65–99)
Glucose-Capillary: 148 mg/dL — ABNORMAL HIGH (ref 65–99)

## 2016-11-03 LAB — CULTURE, BLOOD (ROUTINE X 2)
Culture: NO GROWTH
SPECIAL REQUESTS: ADEQUATE

## 2016-11-03 LAB — PHOSPHORUS: PHOSPHORUS: 2.4 mg/dL — AB (ref 2.5–4.6)

## 2016-11-03 LAB — MAGNESIUM: MAGNESIUM: 2 mg/dL (ref 1.7–2.4)

## 2016-11-03 MED ORDER — ASPIRIN 81 MG PO CHEW
81.0000 mg | CHEWABLE_TABLET | Freq: Every day | ORAL | Status: DC
  Administered 2016-11-03 – 2016-11-12 (×10): 81 mg
  Filled 2016-11-03 (×11): qty 1

## 2016-11-03 MED ORDER — POTASSIUM CHLORIDE 20 MEQ/15ML (10%) PO SOLN
30.0000 meq | ORAL | Status: AC
  Administered 2016-11-03 (×2): 30 meq
  Filled 2016-11-03 (×2): qty 30

## 2016-11-03 MED ORDER — FREE WATER
200.0000 mL | Status: DC
  Administered 2016-11-03 – 2016-11-04 (×7): 200 mL

## 2016-11-03 MED ORDER — K PHOS MONO-SOD PHOS DI & MONO 155-852-130 MG PO TABS
500.0000 mg | ORAL_TABLET | Freq: Two times a day (BID) | ORAL | Status: AC
  Administered 2016-11-03 (×2): 500 mg
  Filled 2016-11-03 (×3): qty 2

## 2016-11-03 MED ORDER — PANTOPRAZOLE SODIUM 40 MG PO PACK
40.0000 mg | PACK | Freq: Every day | ORAL | Status: DC
  Administered 2016-11-03 – 2016-11-11 (×8): 40 mg
  Filled 2016-11-03 (×9): qty 20

## 2016-11-03 NOTE — Progress Notes (Signed)
Subjective: No significant change  Exam: Vitals:   11/03/16 1700 11/03/16 1800  BP: (!) 116/51 (!) 124/52  Pulse: 79 73  Resp: 18 18  Temp:    SpO2: 97% 98%   Gen: In bed, NAD Resp: non-labored breathing, no acute distress Abd: soft, nt  Neuro: MS: Awake, alert, She follows commands to lift arms bilaterally, refuses to cooperate with neck strength testing. CN:  no clear disconjugate gaze, versions appear full Motor: good peripheral strength  Sensory: Intact light touch  Impression: 83 year old female with myasthenia gravis is presenting with respiratory failure. Initial presentation was unclear given the character of her respiratory failure.  She had progressive dysphagia shortly before her abrupt worsening, and I suspect that she may have had an aspiration event secondary to myasthenic flare causing a mixed picture of her respiratory failure. She has been started on PLEX   Recommendations: 1) plasma exchange, 4/5 tomorrow then again on Wednesday 2) prednisone  20mg  daily.(Increased from home dose of 5 mg daily) 3) will follow   Roland Rack, MD [facility] [phone]  If 7pm- 7am, please page neurology on call as listed in [place].

## 2016-11-03 NOTE — Progress Notes (Signed)
[facility] ADULT ICU REPLACEMENT PROTOCOL FOR AM LAB REPLACEMENT ONLY  The patient does apply for the [facility] Adult ICU Electrolyte Replacment Protocol based on the criteria listed below:   1. Is GFR >/= 40 ml/min? Yes.    Patient's GFR today is >60 2. Is urine output >/= 0.5 ml/kg/hr for the last 6 hours? Yes.   Patient's UOP is 1.5 ml/kg/hr 3. Is BUN < 60 mg/dL? Yes.    Patient's BUN today is 12 4. Abnormal electrolyte(s): K 3.1 5. Ordered repletion with: protocol 6. If a panic level lab has been reported, has the CCM MD in charge been notified? No..   Physician:    Ronda Fairly A 11/03/2016 6:17 AM

## 2016-11-03 NOTE — Progress Notes (Signed)
Late entry- NIF -15 VC= pt unable to perform, doesn't cooperate or follow commands for VC.  MD aware.

## 2016-11-03 NOTE — Progress Notes (Signed)
Attempted vent weaning, pt RR increased to 40's despite increasing psv up to 20.  MD aware.

## 2016-11-03 NOTE — Progress Notes (Signed)
PULMONARY / CRITICAL CARE MEDICINE   Name: [name] MRN: [id number] DOB: 09-06-1930    ADMISSION DATE:  10/29/2016 CONSULTATION DATE:  10/29/16  REFERRING MD:  Dr. Laverta Baltimore / EDP   CHIEF COMPLAINT:  SOB  HISTORY OF PRESENT ILLNESS:   83 y/o F who presented to [facility] on 8/7 via EMS from a SNF with reports of increasing shortness of breath and confusion.  Hx Myasthenia Gravis (baseline prednisone 5mg , followed by Dr. Jaynee Eagles) and dementia > lives in a memory care unit.   She was combative and hypoxic with EMS who apparently attempted nasal intubation which was unsuccessful. Saturations were in the 70's on NRB.  She also vomited in route with concern for possible aspiration.  BVM ventilation provided until arrival to ER. On arrival to ER, she unresponsive and was intubated for airway protection.   STUdies  Echo 8/10 nml EF, RVSP 48  CULTURES:  BCx2 8/7 >> ng UC 8/7 >> ng  ANTIBIOTICS: Vanco 8/7 >> 8/10 Zosyn 8/7 >>   SIGNIFICANT EVENTS: 8/07  Admit with SOB, intubated   LINES/TUBES: ETT 8/7 >>  Rt IJ HD cath 8/8 >>  SUBJECTIVE: Critically ill, intubated Low gr febrile Sedated on propofol gtt  VITAL SIGNS: BP (!) 108/49   Pulse 90   Temp 99.4 F (37.4 C) (Axillary)   Resp (!) 30   Ht 5\' 1"  (1.549 m)   Wt 147 lb 7.8 oz (66.9 kg)   SpO2 96%   BMI 27.87 kg/m   HEMODYNAMICS: CVP:  [3 mmHg-28 mmHg] 12 mmHg  VENTILATOR SETTINGS: Vent Mode: PRVC FiO2 (%):  [40 %] 40 % Set Rate:  [18 bmp] 18 bmp Vt Set:  [500 mL] 500 mL PEEP:  [5 cmH20] 5 cmH20 Plateau Pressure:  [19 cmH20-25 cmH20] 23 cmH20  INTAKE / OUTPUT: I/O last 3 completed shifts: In: 3873.9 [I.V.:773.3; NG/GT:1130.6; IV Piggyback:1970] Out: 1915 [Urine:1915]  PHYSICAL EXAMINATION:. Gen:      Elderly,No acute distress HEENT:  EOMI, sclera anicteric, ETT in place Neck:     No masses; no thyromegaly Lungs:    Clear to auscultation bilaterally; normal respiratory effort CV:         Regular  rate and rhythm; no murmurs Abd:      + bowel sounds; soft, non-tender; no palpable masses, no distension Ext:    No edema; adequate peripheral perfusion Skin:      Warm and dry; no rash Neuro: sedated on propofol gtt, follows commands when awake  LABS:  BMET  Recent Labs Lab 11/01/16 0532 11/01/16 0822 11/02/16 0534 11/03/16 0439  NA 145 147* 147* 150*  K 3.9 3.4* 3.1* 3.1*  CL 119* 117* 123* 126*  CO2 17*  --  16* 19*  BUN 8 7 9 12   CREATININE 0.75 0.50 0.60 0.54  GLUCOSE 103* 77 105* 109*    Electrolytes  Recent Labs Lab 11/01/16 0532  11/02/16 0534 11/02/16 1645 11/03/16 0439  CALCIUM 8.1*  --  8.4*  --  8.7*  MG  --   < > 1.9 2.0 2.0  PHOS  --   < > 2.0* 3.1 2.4*  < > = values in this interval not displayed.  CBC  Recent Labs Lab 11/01/16 0310 11/01/16 0822 11/02/16 0534 11/03/16 0439  WBC 8.8  --  7.9 7.5  HGB 10.2* 9.5* 10.4* 9.8*  HCT 31.2* 28.0* 32.6* 29.4*  PLT 124*  --  130* 155    Coag's No results for input(s):  APTT, INR in the last 168 hours.  Sepsis Markers  Recent Labs Lab 10/29/16 2106 10/30/16 0257 10/30/16 0524 10/31/16 0341  LATICACIDVEN 3.2* 3.0* 2.1*  --   PROCALCITON 10.66  --  12.47 6.78    ABG  Recent Labs Lab 10/29/16 1103 10/30/16 0400 10/31/16 0332  PHART 7.332* 7.480* 7.496*  PCO2ART 48.3* 29.3* 27.8*  PO2ART 61.0* 83.8 58.5*    Liver Enzymes No results for input(s): AST, ALT, ALKPHOS, BILITOT, ALBUMIN in the last 168 hours.  Cardiac Enzymes  Recent Labs Lab 10/31/16 0944 10/31/16 1843 10/31/16 2047  TROPONINI 0.06* 0.05* 0.05*    Glucose  Recent Labs Lab 11/02/16 1213 11/02/16 1554 11/02/16 2045 11/02/16 2335 11/03/16 0337 11/03/16 0748  GLUCAP 140* 147* 136* 106* 129* 118*   Imaging Dg Chest Port 1 View  Result Date: 11/03/2016 CLINICAL DATA:  Respiratory failure EXAM: PORTABLE CHEST 1 VIEW COMPARISON:  11/02/2016 FINDINGS: Cardiac shadow is stable. Nasogastric catheter,  endotracheal tube and right jugular central line are again seen and stable. Patchy infiltrates are again noted bilaterally and stable. No new focal infiltrate is seen. No pneumothorax is noted. Old rib fractures are seen bilaterally. IMPRESSION: Stable bilateral infiltrates. Tubes and lines as described. Electronically Signed   By: Inez Catalina M.D.   On: 11/03/2016 07:07  reviewed  STUDIES:     DISCUSSION: 83 y/o F with reported MG on prednisone and advanced dementia (per report, lives in a memory care unit) who was admitted 8/7 with increased SOB.  EMS attempted intubation nasally > pt vomited, concern for aspiration.  Intubated in ER.  Started plasmapheresis 8/9 for myasthenia flare  ASSESSMENT / PLAN:  PULMONARY A: Acute Hypoxic Respiratory Failure  Aspiration pneumonia P:   Continue vent support Follow NIF / VC daily, remains low but difficult to interpret  CARDIOVASCULAR A:  Demand ischemia, peak Tp 0.06 P: Hold further lasix due to soft BP Keep I/O even  ASA 81 daily   RENAL A: Mild Elevation of Lactic Acid. Resolved Hypokalemia /hypophos ? refeeding hypernatremia P:   Add free water - if Na rises further - d5w gtt Trend BMP / urinary output Replace electrolytes as indicated   GASTROINTESTINAL A:   Nausea / Vomiting - suspect related to nasal intubation attempt  P:   ct tube feeds PPI for SUP   HEMATOLOGIC A:   Mild Leukocytosis  P:  Monitor CBC  Heparin for DVT prophylaxis   INFECTIOUS A:   SIRS / Sepsis  Aspiration  pneumonia P:   Continue zozyn day 6/7  ENDOCRINE A:   Hypothyroidism  Hyperglycemia    P:   Continue synthroid Monitor glucose on BMP   NEUROLOGIC A:   Myasthenia Gravis - on baseline prednisone 5mg  QD, followed by Dr. Jaynee Eagles  Plasmapheresis for MG flare P:   RASS goal: 0 to -1  Serial neuro exams  Neurology consult appreciated. Plasmapheresis session x 5 planned Propofol for sedation  Follow serial NIF, VC -but  difficult to interpret in this pt  FAMILY  - Updates: No family at bedside - Inter-disciplinary family meet or Palliative Care meeting due by: 8/16  The patient is critically ill with multiple organ system failure and requires high complexity decision making for assessment and support, frequent evaluation and titration of therapies, advanced monitoring, review of radiographic studies and interpretation of complex data.   Critical Care Time devoted to patient care services, exclusive of separately billable procedures, described in this note is 32 minutes.  Kara Mead MD. [place] Flood. [facility] Pager [phone] If no response call [phone]    11/03/2016, 8:51 AM

## 2016-11-04 ENCOUNTER — Inpatient Hospital Stay (HOSPITAL_COMMUNITY): Payer: Medicare Other

## 2016-11-04 ENCOUNTER — Encounter (HOSPITAL_COMMUNITY): Payer: Self-pay | Admitting: *Deleted

## 2016-11-04 LAB — MAGNESIUM: MAGNESIUM: 1.9 mg/dL (ref 1.7–2.4)

## 2016-11-04 LAB — CBC
HCT: 27.9 % — ABNORMAL LOW (ref 36.0–46.0)
HEMOGLOBIN: 9.4 g/dL — AB (ref 12.0–15.0)
MCH: 31.5 pg (ref 26.0–34.0)
MCHC: 33.7 g/dL (ref 30.0–36.0)
MCV: 93.6 fL (ref 78.0–100.0)
Platelets: 160 10*3/uL (ref 150–400)
RBC: 2.98 MIL/uL — ABNORMAL LOW (ref 3.87–5.11)
RDW: 15.2 % (ref 11.5–15.5)
WBC: 7 10*3/uL (ref 4.0–10.5)

## 2016-11-04 LAB — GLUCOSE, CAPILLARY
GLUCOSE-CAPILLARY: 98 mg/dL (ref 65–99)
GLUCOSE-CAPILLARY: 107 mg/dL — AB (ref 65–99)
GLUCOSE-CAPILLARY: 126 mg/dL — AB (ref 65–99)
GLUCOSE-CAPILLARY: 126 mg/dL — AB (ref 65–99)
Glucose-Capillary: 107 mg/dL — ABNORMAL HIGH (ref 65–99)
Glucose-Capillary: 159 mg/dL — ABNORMAL HIGH (ref 65–99)

## 2016-11-04 LAB — BASIC METABOLIC PANEL
ANION GAP: 6 (ref 5–15)
BUN: 16 mg/dL (ref 6–20)
CALCIUM: 8.8 mg/dL — AB (ref 8.9–10.3)
CO2: 23 mmol/L (ref 22–32)
Chloride: 118 mmol/L — ABNORMAL HIGH (ref 101–111)
Creatinine, Ser: 0.48 mg/dL (ref 0.44–1.00)
Glucose, Bld: 108 mg/dL — ABNORMAL HIGH (ref 65–99)
Potassium: 3.2 mmol/L — ABNORMAL LOW (ref 3.5–5.1)
SODIUM: 147 mmol/L — AB (ref 135–145)

## 2016-11-04 LAB — CULTURE, BLOOD (ROUTINE X 2)
Culture: NO GROWTH
SPECIAL REQUESTS: ADEQUATE

## 2016-11-04 LAB — PHOSPHORUS: PHOSPHORUS: 3.4 mg/dL (ref 2.5–4.6)

## 2016-11-04 LAB — POCT I-STAT, CHEM 8
BUN: 8 mg/dL (ref 6–20)
CALCIUM ION: 1.3 mmol/L (ref 1.15–1.40)
CHLORIDE: 119 mmol/L — AB (ref 101–111)
CREATININE: 0.4 mg/dL — AB (ref 0.44–1.00)
GLUCOSE: 121 mg/dL — AB (ref 65–99)
HCT: 27 % — ABNORMAL LOW (ref 36.0–46.0)
Hemoglobin: 9.2 g/dL — ABNORMAL LOW (ref 12.0–15.0)
POTASSIUM: 2.8 mmol/L — AB (ref 3.5–5.1)
Sodium: 150 mmol/L — ABNORMAL HIGH (ref 135–145)
TCO2: 17 mmol/L (ref 0–100)

## 2016-11-04 LAB — PROTIME-INR
INR: 1.1
PROTHROMBIN TIME: 14.3 s (ref 11.4–15.2)

## 2016-11-04 MED ORDER — SODIUM CHLORIDE 0.9 % IV SOLN: Freq: Once | INTRAVENOUS | Status: DC

## 2016-11-04 MED ORDER — HEPARIN SODIUM (PORCINE) 1000 UNIT/ML IJ SOLN: 1000.0000 [IU] | Freq: Once | INTRAMUSCULAR | Status: DC

## 2016-11-04 MED ORDER — ACETAMINOPHEN 325 MG PO TABS: 650.0000 mg | ORAL_TABLET | ORAL | Status: DC | PRN

## 2016-11-04 MED ORDER — FUROSEMIDE 10 MG/ML IJ SOLN
40.0000 mg | Freq: Two times a day (BID) | INTRAMUSCULAR | Status: AC
  Administered 2016-11-04 (×2): 40 mg via INTRAVENOUS
  Filled 2016-11-04 (×2): qty 4

## 2016-11-04 MED ORDER — FREE WATER
250.0000 mL | Status: DC
  Administered 2016-11-04 – 2016-11-09 (×25): 250 mL

## 2016-11-04 MED ORDER — SODIUM CHLORIDE 0.9 % IV SOLN
INTRAVENOUS | Status: AC
  Administered 2016-11-04 (×4): via INTRAVENOUS_CENTRAL
  Filled 2016-11-04 (×4): qty 200

## 2016-11-04 MED ORDER — CALCIUM GLUCONATE 10 % IV SOLN: 2.0000 g | Freq: Once | INTRAVENOUS | Status: DC

## 2016-11-04 MED ORDER — CALCIUM GLUCONATE 10 % IV SOLN
2.0000 g | Freq: Once | INTRAVENOUS | Status: AC
  Administered 2016-11-04: 2 g via INTRAVENOUS
  Filled 2016-11-04: qty 20

## 2016-11-04 MED ORDER — ACD FORMULA A 0.73-2.45-2.2 GM/100ML VI SOLN: 500.0000 mL | Status: DC

## 2016-11-04 MED ORDER — ACD FORMULA A 0.73-2.45-2.2 GM/100ML VI SOLN
Status: AC
  Administered 2016-11-04: 14:00:00
  Filled 2016-11-04: qty 1000

## 2016-11-04 MED ORDER — FUROSEMIDE 10 MG/ML IJ SOLN: 40.0000 mg | Freq: Once | INTRAMUSCULAR | Status: DC

## 2016-11-04 MED ORDER — CALCIUM GLUCONATE 10 % IV SOLN
2.0000 g | Freq: Once | INTRAVENOUS | Status: DC
  Filled 2016-11-04: qty 20

## 2016-11-04 MED ORDER — VITAL AF 1.2 CAL PO LIQD
1000.0000 mL | ORAL | Status: DC
  Administered 2016-11-04 – 2016-11-12 (×8): 1000 mL
  Filled 2016-11-04: qty 1000

## 2016-11-04 MED ORDER — DIPHENHYDRAMINE HCL 25 MG PO CAPS: 25.0000 mg | ORAL_CAPSULE | Freq: Four times a day (QID) | ORAL | Status: DC | PRN

## 2016-11-04 MED ORDER — ACD FORMULA A 0.73-2.45-2.2 GM/100ML VI SOLN
500.0000 mL | Status: DC
  Administered 2016-11-04: 500 mL via INTRAVENOUS
  Filled 2016-11-04: qty 500

## 2016-11-04 MED ORDER — POTASSIUM CHLORIDE 20 MEQ/15ML (10%) PO SOLN
40.0000 meq | ORAL | Status: AC
  Administered 2016-11-04 (×3): 40 meq
  Filled 2016-11-04 (×3): qty 30

## 2016-11-04 MED ORDER — SODIUM CHLORIDE 0.9 % IV SOLN: 2.0000 g | Freq: Once | INTRAVENOUS | Status: DC

## 2016-11-04 MED ORDER — SODIUM CHLORIDE 0.9 % IV BOLUS (SEPSIS)
500.0000 mL | Freq: Once | INTRAVENOUS | Status: AC
  Administered 2016-11-04: 500 mL via INTRAVENOUS

## 2016-11-04 MED ORDER — SODIUM CHLORIDE 0.9 % IV SOLN
2.0000 g | Freq: Once | INTRAVENOUS | Status: DC
  Filled 2016-11-04: qty 20

## 2016-11-04 MED ORDER — ONDANSETRON HCL 4 MG/2ML IJ SOLN
4.0000 mg | Freq: Four times a day (QID) | INTRAMUSCULAR | Status: DC | PRN
  Administered 2016-11-09 – 2016-11-10 (×2): 4 mg via INTRAVENOUS
  Filled 2016-11-04 (×2): qty 2

## 2016-11-04 MED ORDER — DEXMEDETOMIDINE HCL 200 MCG/2ML IV SOLN
0.4000 ug/kg/h | INTRAVENOUS | Status: DC
  Administered 2016-11-04: 0.6 ug/kg/h via INTRAVENOUS
  Administered 2016-11-04: 0.4 ug/kg/h via INTRAVENOUS
  Administered 2016-11-05: 0.2 ug/kg/h via INTRAVENOUS
  Filled 2016-11-04 (×3): qty 2

## 2016-11-04 NOTE — Progress Notes (Signed)
RT did NIF and VC with patient. Patient able to get a NIF -14 and VC 350

## 2016-11-04 NOTE — Progress Notes (Signed)
Nutrition Follow-up  INTERVENTION:   D/C Prostat  Increase Vital 1.2 to 50 ml/hr Provides: 1440 kcal, 90 grams protein, and 973 ml free water Total free water: 2473 ml   NUTRITION DIAGNOSIS:   Inadequate oral intake related to inability to eat as evidenced by NPO status. Ongoing.   GOAL:   Patient will meet greater than or equal to 90% of their needs Progressing.   MONITOR:   TF tolerance, I & O's  ASSESSMENT:   Pt with PMH of dementia (resides at memory care unit at [facility]) and  myasthenia gravis on prednisone daily admitted with SOB. Nasotracheal intubation attempted but unsuccessful and resulted in emesis with possible aspiration PNA.   Pt discussed during ICU rounds and with RN. Remains intubated MV: 8.5 L/min Temp (24hrs), Avg:98.7 F (37.1 C), Min:98.4 F (36.9 C), Max:100 F (37.8 C)    4/5 plasma exchange today Propofol d/c'ed  Medications reviewed and include: lasix, novolog, 40 mEq KCl every 4 hours, prednisone, vitamin b6 Labs reviewed: K+ 3.2 (L) CBG's: 107-107-126  250 ml free water every 4 hours  Diet Order:  Diet NPO time specified  Skin:  Reviewed, no issues  Last BM:  8/12 smear  Height:   Ht Readings from Last 1 Encounters:  10/31/16 5\' 1"  (1.549 m)    Weight:   Wt Readings from Last 1 Encounters:  11/04/16 147 lb 11.3 oz (67 kg)    Ideal Body Weight:  47.7 kg  BMI:  Body mass index is 27.91 kg/m.  Estimated Nutritional Needs:   Kcal:  1469  Protein:  75-90 grams  Fluid:  > 1.5 L/day  EDUCATION NEEDS:   No education needs identified at this time  [place], [place], [place] Pager [phone] After Hours Pager

## 2016-11-04 NOTE — Progress Notes (Signed)
Subjective: Patient intubated and breathing over vent. Able to follow commands.  Recent  NIF -17, -15   VC 0.35, unable Objective: Current vital signs: BP (!) 105/45   Pulse 82   Temp 98.6 F (37 C) (Axillary)   Resp (!) 27   Ht _0  (1.549 m)   Wt 66.6 kg (146 lb 13.2 oz)   SpO2 94%   BMI 27.74 kg/m  Vital signs in last 24 hours: Temp:  [98.4 F (36.9 C)-98.7 F (37.1 C)] 98.6 F (37 C) (08/13 0805) Pulse Rate:  [64-90] 82 (08/13 1000) Resp:  [11-29] 27 (08/13 1000) BP: (102-148)/(42-87) 105/45 (08/13 1000) SpO2:  [94 %-100 %] 94 % (08/13 1000) FiO2 (%):  [40 %] 40 % (08/13 1000) Weight:  [66.6 kg (146 lb 13.2 oz)] 66.6 kg (146 lb 13.2 oz) (08/12 1100)  Intake/Output from previous day: 08/12 0701 - 08/13 0700 In: 1918.4 [I.V.:228.4; NG/GT:1640; IV Piggyback:50] Out: 4650 [Urine:1380] Intake/Output this shift: Total I/O In: 126.6 [I.V.:21.6; NG/GT:105] Out: -  Nutritional status: Diet NPO time specified  ROS:                                                                                                                                       History obtained from intubated'     Neurologic Exam: Neuro: MS: Awake, alert, She follows commands to lift arms bilaterally, shows weak neck flexion and extension CN:  no clear disconjugate gaze, versions appear full Motor: good peripheral strength  Sensory: Intact light touch  Lab Results: Basic Metabolic Panel:  Recent Labs Lab 11/01/16 0310 11/01/16 0532 11/01/16 3546 11/01/16 1643 11/02/16 0534 11/02/16 0901 11/02/16 1645 11/03/16 0439 11/04/16 0500  NA 143 145 147*  --  147* 150*  --  150* 147*  K 6.0* 3.9 3.4*  --  3.1* 2.8*  --  3.1* 3.2*  CL 120* 119* 117*  --  123* 119*  --  126* 118*  CO2 18* 17*  --   --  16*  --   --  19* 23  GLUCOSE 80 103* 77  --  105* 121*  --  109* 108*  BUN _1 --  9 8  --  12 16  CREATININE 0.69 0.75 0.50  --  0.60 0.40*  --  0.54 0.48  CALCIUM 7.8* 8.1*  --   --  8.4*   --   --  8.7* 8.8*  MG 2.0  --   --  1.7 1.9  --  2.0 2.0 1.9  PHOS 1.7*  --   --  1.6* 2.0*  --  3.1 2.4* 3.4    Liver Function Tests: No results for input(s): AST, ALT, ALKPHOS, BILITOT, PROT, ALBUMIN in the last 168 hours. No results for input(s): LIPASE, AMYLASE in the last 168 hours. No results for input(s): AMMONIA in the last 168 hours.  CBC:  Recent  Labs Lab 10/29/16 1035  10/31/16 0341 11/01/16 0310 11/01/16 0822 11/02/16 0534 11/02/16 0901 11/03/16 0439 11/04/16 0500  WBC 13.7*  < > 10.2 8.8  --  7.9  --  7.5 7.0  NEUTROABS 7.8*  --   --   --   --   --   --   --   --   HGB 15.0  < > 11.0* 10.2* 9.5* 10.4* 9.2* 9.8* 9.4*  HCT 47.4*  < > 34.0* 31.2* 28.0* 32.6* 27.0* 29.4* 27.9*  MCV 97.7  < > 94.2 94.5  --  94.8  --  93.3 93.6  PLT 258  < > 146* 124*  --  130*  --  155 160  < > = values in this interval not displayed.  Cardiac Enzymes:  Recent Labs Lab 10/29/16 2330 10/30/16 0524 10/31/16 0944 10/31/16 1843 10/31/16 2047  TROPONINI 0.09* 0.05* 0.06* 0.05* 0.05*    Lipid Panel:  Recent Labs Lab 10/29/16 2230 11/01/16 1255  TRIG 69 59    CBG:  Recent Labs Lab 11/03/16 1640 11/03/16 1941 11/03/16 2341 11/04/16 0315 11/04/16 0809  GLUCAP 136* 148* 112* 71 107*    Microbiology: Results for orders placed or performed during the hospital encounter of 10/29/16  Urine culture     Status: None   Collection Time: 10/29/16 11:35 AM  Result Value Ref Range Status   Specimen Description URINE, CATHETERIZED  Final   Special Requests Normal  Final   Culture NO GROWTH  Final   Report Status 10/30/2016 FINAL  Final  MRSA PCR Screening     Status: None   Collection Time: 10/29/16  3:00 PM  Result Value Ref Range Status   MRSA by PCR NEGATIVE NEGATIVE Final    Comment:        The GeneXpert MRSA Assay (FDA approved for NASAL specimens only), is one component of a comprehensive MRSA colonization surveillance program. It is not intended to diagnose  MRSA infection nor to guide or monitor treatment for MRSA infections.   Culture, blood (routine x 2)     Status: None   Collection Time: 10/29/16  9:06 PM  Result Value Ref Range Status   Specimen Description BLOOD RIGHT FOOT  Final   Special Requests IN PEDIATRIC BOTTLE Blood Culture adequate volume  Final   Culture NO GROWTH 5 DAYS  Final   Report Status 11/03/2016 FINAL  Final  Culture, blood (Routine X 2) w Reflex to ID Panel     Status: None (Preliminary result)   Collection Time: 10/30/16  2:40 AM  Result Value Ref Range Status   Specimen Description BLOOD RIGHT FOOT  Final   Special Requests IN PEDIATRIC BOTTLE Blood Culture adequate volume  Final   Culture NO GROWTH 4 DAYS  Final   Report Status PENDING  Incomplete    Coagulation Studies:  Recent Labs  11/04/16 0500  LABPROT 14.3  INR 1.10    Imaging: Dg Chest Port 1 View  Result Date: 11/04/2016 CLINICAL DATA:  83 year old female with respiratory failure. Hiatal hernia. Subsequent encounter. EXAM: PORTABLE CHEST 1 VIEW COMPARISON:  11/03/2016. FINDINGS: Endotracheal tube tip 1.3 cm above the carina. This will need to be monitored closely to avoid mainstem bronchus intubation with change of patient's head position or can be retracted by 2 cm. Position unchanged. Right central line tip distal superior vena cava level. Nasogastric tube courses below the diaphragm. Tip is not included on the present exam. Asymmetric airspace disease greater on right.  This may represent infectious infiltrates or asymmetric pulmonary edema with pleural effusions and without change. Cardiomegaly. Calcified tortuous aorta. Bilateral shoulder joint degenerative changes. IMPRESSION: Asymmetric airspace disease greater on right. This may represent infectious infiltrates or asymmetric pulmonary edema with pleural effusions and without change. Cardiomegaly. Endotracheal tube tip 1.3 cm above the carina unchanged. Please see above. Aortic Atherosclerosis  (ICD10-I70.0). Electronically Signed   By: Genia Del M.D.   On: 11/04/2016 07:23   Dg Chest Port 1 View  Result Date: 11/03/2016 CLINICAL DATA:  Respiratory failure EXAM: PORTABLE CHEST 1 VIEW COMPARISON:  11/02/2016 FINDINGS: Cardiac shadow is stable. Nasogastric catheter, endotracheal tube and right jugular central line are again seen and stable. Patchy infiltrates are again noted bilaterally and stable. No new focal infiltrate is seen. No pneumothorax is noted. Old rib fractures are seen bilaterally. IMPRESSION: Stable bilateral infiltrates. Tubes and lines as described. Electronically Signed   By: Inez Catalina M.D.   On: 11/03/2016 07:07    Medications:  Scheduled: . albuterol  2.5 mg Nebulization Q4H  . aspirin  81 mg Per Tube Daily  . calcium gluconate  2 g Intravenous Once  . chlorhexidine gluconate (MEDLINE KIT)  15 mL Mouth Rinse BID  . cycloSPORINE  1 drop Both Eyes BID  . feeding supplement (PRO-STAT SUGAR FREE 64)  30 mL Per Tube BID  . free water  250 mL Per Tube Q4H  . furosemide  40 mg Intravenous BID  . heparin  1,000 Units Intracatheter Once  . heparin  1,000 Units Intracatheter Once  . insulin aspart  0-15 Units Subcutaneous Q4H  . levothyroxine  75 mcg Per Tube QAC breakfast  . mouth rinse  15 mL Mouth Rinse 10 times per day  . pantoprazole sodium  40 mg Per Tube Q1200  . potassium chloride  40 mEq Per Tube Q4H  . predniSONE  20 mg Per Tube Daily  . vitamin B-6  100 mg Per Tube Daily    Assessment/Plan:  83 year old female with myasthenia gravis is presenting with respiratory failure. Initial presentation was unclear given the character of her respiratory failure.  She had progressive dysphagia shortly before her abrupt worsening, and I suspect that she may have had an aspiration event secondary to myasthenic flare causing a mixed picture of her respiratory failure. She has been started on PLEX   Recommendations: 1) plasma exchange, 4/5 today then again on  Wednesday for last dose 2) prednisone  43m daily.(Increased from home dose of 5 mg daily) 3) will follow       DEtta QuillPA-C [facility] [phone] 11/04/2016, 10:37 AM

## 2016-11-04 NOTE — Progress Notes (Signed)
RT Note:  NIF: -17 VC: 0.35 L

## 2016-11-04 NOTE — Progress Notes (Addendum)
CSW alerted by [facility] that medical team would like to discuss treatment and goals with patient's HCPOA. Per previous report, CSW contacted [facility] facility representative to discuss status of POA. [name] is currently patient's financial POA, but not healthcare POA.   Per [employer], patient's chart indicates that [name] is patient's HCPOA; [name] had previously also been patient's financial POA, but gave it up because she couldn't handle it. Patient does not have a living will on file at [facility].   CSW contacted [name] at the number listed on the patient's face sheet to confirm HCPOA status; left voicemail. CSW will update with additional information when available.  Laveda Abbe, [place] Clinical Social Worker [phone]   UPDATE 2:58 PM:  Patient's HCPOA is [name]; contact information is listed on the patient's face sheet. She says she brought in the paperwork over the weekend for the patient's chart; she will be stopping by later this afternoon to see the patient. CSW alerted her that medical team will be contacting her at some point to review medical information and discuss goals of care.  Laveda Abbe, [place] Clinical Social Worker [phone]

## 2016-11-04 NOTE — Progress Notes (Signed)
PULMONARY / CRITICAL CARE MEDICINE   Name: [name] MRN: [id number] DOB: [birth year]    ADMISSION DATE:  10/29/2016 CONSULTATION DATE:  10/29/16  REFERRING MD:  Dr. Laverta Baltimore / EDP   CHIEF COMPLAINT:  SOB  HISTORY OF PRESENT ILLNESS:   83 y/o F who presented to [facility] on 8/7 via EMS from a SNF with reports of increasing shortness of breath and confusion.  Hx Myasthenia Gravis (baseline prednisone 5mg , followed by Dr. Jaynee Eagles) and dementia > lives in a memory care unit.   She was combative and hypoxic with EMS who apparently attempted nasal intubation which was unsuccessful. Saturations were in the 70's on NRB.  She also vomited in route with concern for possible aspiration.  BVM ventilation provided until arrival to ER. On arrival to ER, she unresponsive and was intubated for airway protection.   STUdies  Echo 8/10 nml EF, RVSP 48  CULTURES:  BCx2 8/7 >> ng UC 8/7 >> ng  ANTIBIOTICS: Vanco 8/7 >> 8/10 Zosyn 8/7 >> 8/13  SIGNIFICANT EVENTS: 8/07  Admit with SOB, intubated   LINES/TUBES: ETT 8/7 >>  Rt IJ HD cath 8/8 >>  SUBJECTIVE: Remains on propofol NIF -17 this morning Planning for fourth plasmapheresis today 8/13 She has complained of nausea this morning  VITAL SIGNS: BP 132/63   Pulse 71   Temp 98.6 F (37 C) (Axillary)   Resp 16   Ht 5\' 1"  (1.549 m)   Wt 66.6 kg (146 lb 13.2 oz)   SpO2 96%   BMI 27.74 kg/m   HEMODYNAMICS: CVP:  [3 mmHg-14 mmHg] 11 mmHg  VENTILATOR SETTINGS: Vent Mode: PSV;CPAP FiO2 (%):  [40 %] 40 % Set Rate:  [18 bmp] 18 bmp Vt Set:  [500 mL] 500 mL PEEP:  [5 cmH20] 5 cmH20 Pressure Support:  [10 cmH20] 10 cmH20 Plateau Pressure:  [18 cmH20-22 cmH20] 22 cmH20  INTAKE / OUTPUT: I/O last 3 completed shifts: In: 2577.5 [I.V.:467.5; NG/GT:2060; IV Piggyback:50] Out: 1995 [TZGYF:7494]  PHYSICAL EXAMINATION:. Gen:      Elderly obese woman, ventilated HEENT: ET tube in place, able to open eyes widely, pupils  equal Neck:     No mass, no bruit Lungs:  Decreased at bilateral bases, no wheezing CV:   Regular, no murmur Abd:  Soft, nontender, nondistended, positive bowel sounds Ext: No edema, some pain on palpation of pretibial area Skin:  Dry, no rash Neuro: Intermittently following commands, weak cough, able to close eyes tightly, open them and keep them open wide, good upper extremity strength  LABS:  BMET  Recent Labs Lab 11/02/16 0534 11/03/16 0439 11/04/16 0500  NA 147* 150* 147*  K 3.1* 3.1* 3.2*  CL 123* 126* 118*  CO2 16* 19* 23  BUN 9 12 16   CREATININE 0.60 0.54 0.48  GLUCOSE 105* 109* 108*    Electrolytes  Recent Labs Lab 11/02/16 0534 11/02/16 1645 11/03/16 0439 11/04/16 0500  CALCIUM 8.4*  --  8.7* 8.8*  MG 1.9 2.0 2.0 1.9  PHOS 2.0* 3.1 2.4* 3.4    CBC  Recent Labs Lab 11/02/16 0534 11/03/16 0439 11/04/16 0500  WBC 7.9 7.5 7.0  HGB 10.4* 9.8* 9.4*  HCT 32.6* 29.4* 27.9*  PLT 130* 155 160    Coag's  Recent Labs Lab 11/04/16 0500  INR 1.10    Sepsis Markers  Recent Labs Lab 10/29/16 2106 10/30/16 0257 10/30/16 0524 10/31/16 0341  LATICACIDVEN 3.2* 3.0* 2.1*  --   PROCALCITON 10.66  --  12.47 6.78    ABG  Recent Labs Lab 10/29/16 1103 10/30/16 0400 10/31/16 0332  PHART 7.332* 7.480* 7.496*  PCO2ART 48.3* 29.3* 27.8*  PO2ART 61.0* 83.8 58.5*    Liver Enzymes No results for input(s): AST, ALT, ALKPHOS, BILITOT, ALBUMIN in the last 168 hours.  Cardiac Enzymes  Recent Labs Lab 10/31/16 0944 10/31/16 1843 10/31/16 2047  TROPONINI 0.06* 0.05* 0.05*    Glucose  Recent Labs Lab 11/03/16 1231 11/03/16 1640 11/03/16 1941 11/03/16 2341 11/04/16 0315 11/04/16 0809  GLUCAP 114* 136* 148* 112* 98 107*   Imaging Dg Chest Port 1 View  Result Date: 11/04/2016 CLINICAL DATA:  83 year old female with respiratory failure. Hiatal hernia. Subsequent encounter. EXAM: PORTABLE CHEST 1 VIEW COMPARISON:  11/03/2016. FINDINGS:  Endotracheal tube tip 1.3 cm above the carina. This will need to be monitored closely to avoid mainstem bronchus intubation with change of patient's head position or can be retracted by 2 cm. Position unchanged. Right central line tip distal superior vena cava level. Nasogastric tube courses below the diaphragm. Tip is not included on the present exam. Asymmetric airspace disease greater on right. This may represent infectious infiltrates or asymmetric pulmonary edema with pleural effusions and without change. Cardiomegaly. Calcified tortuous aorta. Bilateral shoulder joint degenerative changes. IMPRESSION: Asymmetric airspace disease greater on right. This may represent infectious infiltrates or asymmetric pulmonary edema with pleural effusions and without change. Cardiomegaly. Endotracheal tube tip 1.3 cm above the carina unchanged. Please see above. Aortic Atherosclerosis (ICD10-I70.0). Electronically Signed   By: Genia Del M.D.   On: 11/04/2016 07:23  reviewed  STUDIES:     DISCUSSION: 83 y/o F with reported MG on prednisone and advanced dementia (per report, lives in a memory care unit) who was admitted 8/7 with increased SOB.  EMS attempted intubation nasally > pt vomited, concern for aspiration.  Intubated in ER.  Started plasmapheresis 8/9 for myasthenia flare  ASSESSMENT / PLAN:  PULMONARY A: Acute Hypoxic Respiratory Failure, multifactorial due to my sending a flare plus possible aspiration event Aspiration pneumonia P:   Continue current ventilator support, pressure support as she can tolerate Follow improvement in NIF, VC daily. Her ability to follow commands and participate is a barrier to obtaining accurate data Attempt to change propofol to an alternative on 8/13 to avoid any impact of sedation on her ability to wean  CARDIOVASCULAR A:  Demand ischemia, peak Tp 0.06 P:  Dosing diuretics daily Goal I=O ASA   RENAL A: Mild Elevation of Lactic Acid.  Resolved Hypokalemia /hypophos ? refeeding hypernatremia P:   Increase free water 8/13 Follow BMP, urine output Replace electrolytes as indicated Avoid nephrotoxins, ensure renal perfusion   GASTROINTESTINAL A:   Nausea / Vomiting - suspect related to nasal intubation attempt  P:   Tube feeding Add Zofran when necessary PPI for SUP  HEMATOLOGIC A:   Mild Leukocytosis  P:  Follow CBC Heparin for DVT prophylaxis  INFECTIOUS A:   SIRS / Sepsis Aspiration  pneumonia P:   Zosyn day 7 of 7 on 8/13  ENDOCRINE A:   Hypothyroidism  Hyperglycemia    P:   Continue Synthroid Sliding scale insulin and CBG per protocol  NEUROLOGIC A:   Myasthenia Gravis - on baseline prednisone 5mg  QD, followed by Dr. Jaynee Eagles  Plasmapheresis for MG flare P:   RASS goal: 0 to -1  Follow serial neuro exam Change propofol to Precedex on 8/13 Plasmapheresis session 4 of 5 on 8/13 Follow serial NIF, FC  FAMILY  - Updates: Per the nurse's report today there is no immediate family. Unclear who is making medical decisions, told that it is someone at her SNF, memory care unit. We will attempt to communicate with them once identified. Need to discuss goals for care, appropriateness of reintubation at the time of extubation, escalation of care including possible tracheostomy. Suspect in this chronically ill patient with dementia that tracheostomy would be inappropriate.  - Inter-disciplinary family meet or Palliative Care meeting due by: 8/16  Independent CC time 35 minutes  Baltazar Apo, MD, PhD 11/04/2016, 8:57 AM [place] [facility] [phone] or if no answer [phone]

## 2016-11-04 NOTE — Progress Notes (Signed)
eLink Physician-Brief Progress Note Patient Name: [name] DOB: [birth year] MRN: [id number]   Date of Service  11/04/2016  HPI/Events of Note  Hypotension - BP = 90/43. LVEF = 60% to 70%.  eICU Interventions  Will bolus with 0.9 NaCl 500 mL IV over 30 minutes now.      Intervention Category Major Interventions: Hypotension - evaluation and management  Samadhi Mahurin Eugene 11/04/2016, 10:50 PM

## 2016-11-05 ENCOUNTER — Inpatient Hospital Stay (HOSPITAL_COMMUNITY): Payer: Medicare Other

## 2016-11-05 DIAGNOSIS — J9601 Acute respiratory failure with hypoxia: Secondary | ICD-10-CM

## 2016-11-05 LAB — POCT I-STAT 3, ART BLOOD GAS (G3+)
ACID-BASE EXCESS: 4 mmol/L — AB (ref 0.0–2.0)
Bicarbonate: 27.8 mmol/L (ref 20.0–28.0)
O2 Saturation: 99 %
PCO2 ART: 38.6 mmHg (ref 32.0–48.0)
PH ART: 7.467 — AB (ref 7.350–7.450)
PO2 ART: 118 mmHg — AB (ref 83.0–108.0)
Patient temperature: 98.6
TCO2: 29 mmol/L (ref 0–100)

## 2016-11-05 LAB — BASIC METABOLIC PANEL
Anion gap: 4 — ABNORMAL LOW (ref 5–15)
BUN: 20 mg/dL (ref 6–20)
CHLORIDE: 114 mmol/L — AB (ref 101–111)
CO2: 25 mmol/L (ref 22–32)
CREATININE: 0.54 mg/dL (ref 0.44–1.00)
Calcium: 8.1 mg/dL — ABNORMAL LOW (ref 8.9–10.3)
GFR calc Af Amer: >60 mL/min (ref >60)
GFR calc non Af Amer: >60 mL/min (ref >60)
GLUCOSE: 111 mg/dL — AB (ref 65–99)
Potassium: 3.5 mmol/L (ref 3.5–5.1)
Sodium: 143 mmol/L (ref 135–145)

## 2016-11-05 LAB — POCT I-STAT 3, VENOUS BLOOD GAS (G3P V)
ACID-BASE EXCESS: 2 mmol/L (ref 0.0–2.0)
BICARBONATE: 26 mmol/L (ref 20.0–28.0)
O2 Saturation: 74 %
PO2 VEN: 36 mmHg (ref 32.0–45.0)
TCO2: 27 mmol/L (ref 0–100)
pCO2, Ven: 35.4 mmHg — ABNORMAL LOW (ref 44.0–60.0)
pH, Ven: 7.474 — ABNORMAL HIGH (ref 7.250–7.430)

## 2016-11-05 LAB — GLUCOSE, CAPILLARY
GLUCOSE-CAPILLARY: 97 mg/dL (ref 65–99)
GLUCOSE-CAPILLARY: 77 mg/dL (ref 65–99)
GLUCOSE-CAPILLARY: 76 mg/dL (ref 65–99)
Glucose-Capillary: 111 mg/dL — ABNORMAL HIGH (ref 65–99)
Glucose-Capillary: 139 mg/dL — ABNORMAL HIGH (ref 65–99)
Glucose-Capillary: 132 mg/dL — ABNORMAL HIGH (ref 65–99)

## 2016-11-05 LAB — MAGNESIUM: Magnesium: 1.6 mg/dL — ABNORMAL LOW (ref 1.7–2.4)

## 2016-11-05 LAB — PHOSPHORUS: PHOSPHORUS: 3.1 mg/dL (ref 2.5–4.6)

## 2016-11-05 MED ORDER — FENTANYL CITRATE (PF) 100 MCG/2ML IJ SOLN
INTRAMUSCULAR | Status: AC
  Administered 2016-11-05: 100 ug
  Filled 2016-11-05: qty 2

## 2016-11-05 MED ORDER — POTASSIUM CHLORIDE 20 MEQ/15ML (10%) PO SOLN: 20.0000 meq | Freq: Once | ORAL | Status: DC

## 2016-11-05 MED ORDER — SODIUM CHLORIDE 0.9 % IV BOLUS (SEPSIS)
1000.0000 mL | Freq: Once | INTRAVENOUS | Status: AC
  Administered 2016-11-05: 1000 mL via INTRAVENOUS

## 2016-11-05 MED ORDER — CHLORHEXIDINE GLUCONATE 0.12% ORAL RINSE (MEDLINE KIT)
15.0000 mL | Freq: Two times a day (BID) | OROMUCOSAL | Status: DC
  Administered 2016-11-05 – 2016-11-11 (×11): 15 mL via OROMUCOSAL

## 2016-11-05 MED ORDER — POTASSIUM CHLORIDE 20 MEQ/15ML (10%) PO SOLN
40.0000 meq | Freq: Once | ORAL | Status: AC
  Administered 2016-11-05: 40 meq
  Filled 2016-11-05 (×2): qty 30

## 2016-11-05 MED ORDER — SODIUM CHLORIDE 0.9 % IV SOLN
0.4000 ug/kg/h | INTRAVENOUS | Status: DC
  Administered 2016-11-05: 0.1 ug/kg/h via INTRAVENOUS
  Administered 2016-11-05 – 2016-11-07 (×3): 0.4 ug/kg/h via INTRAVENOUS
  Administered 2016-11-07 (×2): 0.5 ug/kg/h via INTRAVENOUS
  Filled 2016-11-05 (×6): qty 2

## 2016-11-05 MED ORDER — POTASSIUM CHLORIDE 20 MEQ/15ML (10%) PO SOLN
20.0000 meq | ORAL | Status: AC
  Administered 2016-11-05 (×2): 20 meq
  Filled 2016-11-05 (×2): qty 15

## 2016-11-05 MED ORDER — CHLORHEXIDINE GLUCONATE 0.12 % MT SOLN
15.0000 mL | Freq: Two times a day (BID) | OROMUCOSAL | Status: DC
  Administered 2016-11-05: 15 mL via OROMUCOSAL

## 2016-11-05 MED ORDER — MIDAZOLAM HCL 2 MG/2ML IJ SOLN
INTRAMUSCULAR | Status: AC
  Administered 2016-11-05: 2 mg
  Filled 2016-11-05: qty 2

## 2016-11-05 MED ORDER — ETOMIDATE 2 MG/ML IV SOLN
18.0000 mg | Freq: Once | INTRAVENOUS | Status: AC
  Administered 2016-11-05: 18 mg via INTRAVENOUS

## 2016-11-05 MED ORDER — ORAL CARE MOUTH RINSE
15.0000 mL | Freq: Four times a day (QID) | OROMUCOSAL | Status: DC
  Administered 2016-11-05 – 2016-11-11 (×26): 15 mL via OROMUCOSAL

## 2016-11-05 MED ORDER — SCOPOLAMINE 1 MG/3DAYS TD PT72
1.0000 | MEDICATED_PATCH | TRANSDERMAL | Status: DC
  Administered 2016-11-05 – 2016-11-08 (×2): 1.5 mg via TRANSDERMAL
  Filled 2016-11-05 (×4): qty 1

## 2016-11-05 MED ORDER — SODIUM CHLORIDE 0.9 % IV SOLN
0.0000 ug/kg/h | INTRAVENOUS | Status: DC
  Filled 2016-11-05: qty 2

## 2016-11-05 MED ORDER — FUROSEMIDE 10 MG/ML IJ SOLN
40.0000 mg | Freq: Once | INTRAMUSCULAR | Status: AC
  Administered 2016-11-05: 40 mg via INTRAVENOUS
  Filled 2016-11-05: qty 4

## 2016-11-05 MED ORDER — ORAL CARE MOUTH RINSE
15.0000 mL | Freq: Two times a day (BID) | OROMUCOSAL | Status: DC
  Administered 2016-11-05: 15 mL via OROMUCOSAL

## 2016-11-05 MED ORDER — MAGNESIUM SULFATE 2 GM/50ML IV SOLN
2.0000 g | Freq: Once | INTRAVENOUS | Status: AC
  Administered 2016-11-05: 2 g via INTRAVENOUS
  Filled 2016-11-05: qty 50

## 2016-11-05 NOTE — Progress Notes (Signed)
eLink Physician-Brief Progress Note Patient Name: [name] DOB: 13-Mar-1931 MRN: [id number]   Date of Service  11/05/2016  HPI/Events of Note  Hypotension - BP = 70/44. CVP = 6.   eICU Interventions  Will order: 1. Bolus with 0.9 NaCl 1 liter IV over 1 hour now.      Intervention Category Major Interventions: Hypotension - evaluation and management  Lysle Dingwall 11/05/2016, 6:44 PM

## 2016-11-05 NOTE — Progress Notes (Signed)
[facility] ADULT ICU REPLACEMENT PROTOCOL FOR AM LAB REPLACEMENT ONLY  The patient does apply for the [facility] Adult ICU Electrolyte Replacment Protocol based on the criteria listed below:   1. Is GFR >/= 40 ml/min? Yes.    Patient's GFR today is >60 2. Is urine output >/= 0.5 ml/kg/hr for the last 6 hours? Yes.  Patient's UOP is 1.9 ml/kg/hr 3. Is BUN < 60 mg/dL? Yes.    Patient's BUN today is 20 4. Abnormal electrolyte(s): K+3.5 Mg 1.6 5. Ordered repletion with: protocol 6. If a panic level lab has been reported, has the CCM MD in charge been notified? No..   Physician:  Oneita Jolly, Eddie Dibbles Hilliard 11/05/2016 5:20 AM

## 2016-11-05 NOTE — Progress Notes (Signed)
SLP Cancellation Note  Patient Details Name: [name] MRN: [id number] DOB: [birth year]   Cancelled treatment:       Reason Eval/Treat Not Completed: Patient not medically ready. Order received for swallow evaluation. Pt was extubated this morning but per chart is currently requiring NRB due to desaturations, difficulty managing secretions. Per MD note she is at a high risk for reintubation. SLP will f/u on next date to assess for readiness.   Germain Osgood 11/05/2016, 2:25 PM  Germain Osgood, M.A. CCC-SLP [phone]

## 2016-11-05 NOTE — Progress Notes (Addendum)
Subjective: Patient doing better on NIF and VC today. PCCM planning to extubate.   Exam: Vitals:   11/05/16 0900 11/05/16 1010  BP: (!) 115/46   Pulse: 82   Resp: (!) 22   Temp:    SpO2: 100% (!) 85%    HEENT-  Normocephalic, no lesions, without obvious abnormality.  Normal external eye and conjunctiva.  Normal TM's bilaterally.  Normal auditory canals and external ears. Normal external nose, mucus membranes and septum.  Normal pharynx.    Neuro:  CN: Pupils are equal and round. They are symmetrically reactive from 3-->2 mm. EOMI without nystagmus. Facial sensation is intact to light touch. Face is symmetric at rest with normal strength and mobility. Hearing is intact to conversational voice. Motor: Normal bulk, tone, and strength. 5/5 throughout. No drift.  Sensation: Intact to light touch.  DTRs: 2+, symmetric  Toes downgoing bilaterally.     Pertinent Labs/Diagnostics: NIF of -24 and a VC of .4L with good effort   Etta Quill PA-C [facility] [phone]  Impression: 83 year old female with myasthenia gravis is presenting with respiratory failure. Initial presentation was unclear given the character of her respiratory failure. She hadprogressive dysphagiashortly before her abrupt worsening, and I suspect that she may have had an aspiration event secondary to myasthenic flare causing a mixed picture of her respiratory failure. She has been started on PLEX  Recommendations: 1) plasma exchange, 4/5 yesterdaythen again on tomorrow for last dose 2) prednisone 20mg  daily.(Increased from home dose of 5 mg daily) 3) PCCM planning to attempt extubation today  11/05/2016, 10:21 AM   NEUROHOSPITALIST ADDENDUM Seen and examined the patient this AM. Formulated plan as documented above.   Patient extubated on examination, maintaining saturation well and alert. Tenous breathing with low NIFs,  Neck flexors still weakness and single breath count less than 5. Will  need to be watched closely and re intubate in declines. Continue PLEX.  Karena Addison Cobain Morici MD [facility] [phone]  If 7pm to 7am, please call on call as listed on AMION.

## 2016-11-05 NOTE — Progress Notes (Signed)
1140 paged RT and MD regarding patient respiratory status. Oxygen saturation levels dropping to mid-high 80's, patient drooling and having difficulty with oral secretions. Patient switched from [place] to venti mask. Requiring frequent deep oral suctioning.  1334 Patient switched to NRB due to desaturation on venti mask. Still requiring frequent oral suctioning. Sats improved. RT notified of switch to NRB.

## 2016-11-05 NOTE — Progress Notes (Signed)
Patient had a NIF of -24 and a VC of .4L with good effort.

## 2016-11-05 NOTE — Progress Notes (Signed)
PCCM Interval Progress Note   S:  Called to bedside by RN for intermittent desaturations in oxygen over the last 30 mins now requiring VM at 55%, post-extubation at 4-6L [place].  Patient has had strong cough, drooling, and difficulty with oral secretions.  Patient will not use her hand-held suction and having to be frequently suctioned orally by RN.  O:  General:  Elderly female, sitting in bed in NAD HEENT: MM pink/moist, copious oral secretions, able to verbalize single word with increased effort Neuro: Awake, follows commands, MAE 5/5 CV: rrr PULM: even/non-labored, mildly tachypneic,  lungs bilaterally rhonchi R>L, strong cough on demand Extremities: warm/dry  Blood pressure 140/66, pulse (!) 103, temperature 98.4 F (36.9 C), temperature source Oral, resp. rate (!) 27, height 5\' 1"  (1.549 m), weight 139 lb 12.4 oz (63.4 kg), SpO2 95 %.  A: Acute hypoxic respiratory failure - multifactorial w/MG flare + aspiration Aspiration pneumonia  MG flare - NIF 24  And VC 0.4L prior to extubation - patient with previous ocular and swallowing issues with previous MG flare  P:  Remains at high risk for reintubation Scopolamine patch in place to help with secretions Continue supplemental O2 as needed for sats > 92% NTS prn SLP evaluation pending Will place cortrak  5/5 PLEX treatment scheduled for today, hopeful this will help Prednisone 20mg  daily per Neurology  Family update: Patients [place], Liborio Nixon [phone], called and updated by Dr. Lamonte Sakai.  Patient remains full code.    Additional CCT 30 mins  Kennieth Rad, AGACNP-BC [place] [facility] Pgr: [phone] or if no answer [phone] 11/05/2016, 12:40 PM

## 2016-11-05 NOTE — Progress Notes (Signed)
PULMONARY / CRITICAL CARE MEDICINE   Name: [name] MRN: [id number] DOB: 04/07/1930    ADMISSION DATE:  10/29/2016 CONSULTATION DATE:  10/29/16  REFERRING MD:  Dr. Laverta Baltimore / EDP   CHIEF COMPLAINT:  SOB  HISTORY OF PRESENT ILLNESS:   83 y/o F who presented to [facility] on 8/7 via EMS from a SNF with reports of increasing shortness of breath and confusion.  Hx Myasthenia Gravis (baseline prednisone 5mg , followed by Dr. Jaynee Eagles) and dementia > lives in a memory care unit.   She was combative and hypoxic with EMS who apparently attempted nasal intubation which was unsuccessful. Saturations were in the 70's on NRB.  She also vomited in route with concern for possible aspiration.  BVM ventilation provided until arrival to ER. On arrival to ER, she unresponsive and was intubated for airway protection.   STUdies  Echo 8/10 nml EF, RVSP 48  CULTURES:  BCx2 8/7 >> ng UC 8/7 >> ng  ANTIBIOTICS: Vanco 8/7 >> 8/10 Zosyn 8/7 >> 8/13  SIGNIFICANT EVENTS: 8/07  Admit with SOB, intubated   LINES/TUBES: ETT 8/7 >>  Rt IJ HD cath 8/8 >>  SUBJECTIVE: Currently off propofol, changed to Precedex Tolerating pressure support 12  VITAL SIGNS: BP (!) 118/52 (BP Location: Right Arm)   Pulse 77   Temp 98.4 F (36.9 C) (Axillary)   Resp (!) 23   Ht 5\' 1"  (1.549 m)   Wt 63.4 kg (139 lb 12.4 oz)   SpO2 99%   BMI 26.41 kg/m   HEMODYNAMICS: CVP:  [5 mmHg-11 mmHg] 11 mmHg  VENTILATOR SETTINGS: Vent Mode: PSV;CPAP FiO2 (%):  [40 %] 40 % Set Rate:  [12 bmp] 12 bmp Vt Set:  [500 mL] 500 mL PEEP:  [5 cmH20] 5 cmH20 Pressure Support:  [10 cmH20-12 cmH20] 12 cmH20 Plateau Pressure:  [19 cmH20-20 cmH20] 20 cmH20  INTAKE / OUTPUT: I/O last 3 completed shifts: In: 3150.9 [I.V.:248.4; NG/GT:2752.5; IV Piggyback:150] Out: 6290 [Urine:6290]  PHYSICAL EXAMINATION:. Gen:      Elderly obese woman, ventilated HEENT: ET tube in place, able to open eyes widely, pupils equal Neck:     No  mass, no bruit Lungs:  Decreased at bilateral bases, no wheezing CV:   Regular, no murmur Abd:  Soft, nontender, nondistended, positive bowel sounds Ext: No edema, some pain on palpation of pretibial area Skin:  Dry, no rash Neuro: Intermittently following commands, weak cough, able to close eyes tightly, open them and keep them open wide, good upper extremity strength  LABS:  BMET  Recent Labs Lab 11/03/16 0439 11/04/16 0500 11/05/16 0324  NA 150* 147* 143  K 3.1* 3.2* 3.5  CL 126* 118* 114*  CO2 19* 23 25  BUN 12 16 20   CREATININE 0.54 0.48 0.54  GLUCOSE 109* 108* 111*    Electrolytes  Recent Labs Lab 11/03/16 0439 11/04/16 0500 11/05/16 0324  CALCIUM 8.7* 8.8* 8.1*  MG 2.0 1.9 1.6*  PHOS 2.4* 3.4 3.1    CBC  Recent Labs Lab 11/02/16 0534 11/02/16 0901 11/03/16 0439 11/04/16 0500  WBC 7.9  --  7.5 7.0  HGB 10.4* 9.2* 9.8* 9.4*  HCT 32.6* 27.0* 29.4* 27.9*  PLT 130*  --  155 160    Coag's  Recent Labs Lab 11/04/16 0500  INR 1.10    Sepsis Markers  Recent Labs Lab 10/29/16 2106 10/30/16 0257 10/30/16 0524 10/31/16 0341  LATICACIDVEN 3.2* 3.0* 2.1*  --   PROCALCITON 10.66  --  12.47 6.78    ABG  Recent Labs Lab 10/29/16 1103 10/30/16 0400 10/31/16 0332  PHART 7.332* 7.480* 7.496*  PCO2ART 48.3* 29.3* 27.8*  PO2ART 61.0* 83.8 58.5*    Liver Enzymes No results for input(s): AST, ALT, ALKPHOS, BILITOT, ALBUMIN in the last 168 hours.  Cardiac Enzymes  Recent Labs Lab 10/31/16 0944 10/31/16 1843 10/31/16 2047  TROPONINI 0.06* 0.05* 0.05*    Glucose  Recent Labs Lab 11/04/16 1135 11/04/16 1521 11/04/16 1949 11/04/16 2320 11/05/16 0312 11/05/16 0757  GLUCAP 107* 126* 159* 126* 97 111*   Imaging Dg Chest Port 1 View  Result Date: 11/05/2016 CLINICAL DATA:  Respiratory failure. EXAM: PORTABLE CHEST 1 VIEW COMPARISON:  Radiograph of November 04, 2016. FINDINGS: Stable cardiomegaly and central pulmonary vascular  congestion. Endotracheal and nasogastric tubes are unchanged in position. Right internal jugular catheter is unchanged in position. No pneumothorax is noted. Stable bibasilar lung opacities are noted concerning for edema or possibly pneumonia. Mild bilateral pleural effusions are noted with left greater than right. Degenerative changes seen involving both glenohumeral joints. IMPRESSION: Stable cardiomegaly and central pulmonary vascular congestion. Stable support apparatus. Stable bilateral lung opacities as described above. Electronically Signed   By: Marijo Conception, M.D.   On: 11/05/2016 07:30  reviewed  STUDIES:     DISCUSSION: 83 y/o F with reported MG on prednisone and advanced dementia (per report, lives in a memory care unit) who was admitted 8/7 with increased SOB.  EMS attempted intubation nasally > pt vomited, concern for aspiration.  Intubated in ER.  Started plasmapheresis 8/9 for myasthenia flare  ASSESSMENT / PLAN:  PULMONARY A: Acute Hypoxic Respiratory Failure, multifactorial due to my sending a flare plus possible aspiration event Aspiration pneumonia P:   Continue to push pressure support as she can tolerate Follow NIF, vital capacity daily. Improved to NIF -24, VC 400cc. She does appear a bit stronger, more awake, stronger cough. Minimize sedation  CARDIOVASCULAR A:  Demand ischemia, peak Tp 0.06 Cardiogenic pulm edema P:  Repeat diuretic dosing on 8/14 Goal another 1 L diuresis next 24 hours Aspirin  RENAL A: Mild Elevation of Lactic Acid. Resolved Hypokalemia /hypophos ? refeeding hypernatremia P:   Increase free water 8/13 Follow BMP, urine output Replace electrolytes as indicated Avoid nephrotoxins, ensure renal perfusion   GASTROINTESTINAL A:   Nausea / Vomiting - suspect related to nasal intubation attempt  P:   TF as ordered Continue Zofran as needed Protonix for prophylaxis  HEMATOLOGIC A:   Mild Leukocytosis  P:  Follow  CBC Heparin for DVT prophylaxis  INFECTIOUS A:   SIRS / Sepsis Aspiration  pneumonia P:   Zosyn 7 days completed on 8/13  ENDOCRINE A:   Hypothyroidism  Hyperglycemia    P:   Continue Synthroid Sliding scale insulin and CBG per protocol  NEUROLOGIC A:   Myasthenia Gravis - on baseline prednisone 5mg  QD, followed by Dr. Jaynee Eagles  Plasmapheresis for MG flare P:   RASS goal: 0 to -1  Serial neuro exams, if, vital capacity Prednisone 20 mg Planning for plasmapheresis session 5 of 5 on 8/14 Minimize all sedation   FAMILY  - Updates: Per the nurse's report 8/13 there is no immediate family. Unclear who is making medical decisions, told that it is someone at her SNF, memory care unit. We will attempt to communicate with them once identified. Need to discuss goals for care, appropriateness of reintubation at the time of extubation, escalation of care including possible tracheostomy. Suspect  in this chronically ill patient with dementia that tracheostomy would be inappropriate.  - Inter-disciplinary family meet or Palliative Care meeting due by: 8/16  Independent CC time 38 minutes  Baltazar Apo, MD, PhD 11/05/2016, 9:30 AM [place] [facility] [phone] or if no answer [phone]

## 2016-11-05 NOTE — Progress Notes (Signed)
PCCM Interval Progress Note  Patient with impaired airway secretion with strong cough but unable to clear secretions and increasing O2 requirements from VM to HFNC to intermittent NRB with intermittent desaturations with increasing agitation.  Will precede with intubation for acute hypoxic respiratory failure.    P:  Intubate now Stat CXR Full vent support PAD protocol with Precedex and prn fentanyl VAP measures  Patient's HPOA and updated.    Additional CCT 30 mins  Kennieth Rad, AGACNP-BC [place] [facility] Pgr: [phone] or if no answer [phone] 11/05/2016, 5:28 PM

## 2016-11-05 NOTE — Procedures (Signed)
Intubation Procedure Note CADINCE HILSCHER [id number] 1930-08-08  Procedure: Intubation Indications: Respiratory insufficiency  Procedure Details Consent: Risks of procedure as well as the alternatives and risks of each were explained to the (patient/caregiver).  Consent for procedure obtained. Time Out: Verified patient identification, verified procedure, site/side was marked, verified correct patient position, special equipment/implants available, medications/allergies/relevent history reviewed, required imaging and test results available.  Performed Presedated with Fentanyl 100 mcg, Versed 2 mg, and Etomidate 18 mg.    Maximum sterile technique was used including gloves, gown, hand hygiene and mask.  MAC and 3 glidescope.  Grade 1 airway view.  7. 5 ETT secured at 22 cm at the lip.  Evaluation Hemodynamic Status: BP stable throughout; O2 sats: stable throughout Patient's Current Condition: stable Complications: No apparent complications Patient did tolerate procedure well. Chest X-ray ordered to verify placement.  CXR: pending.   Procedure performed under direct supervision of Dr. Lamonte Sakai.  Kennieth Rad, AGACNP-BC [place] [facility] Pgr: [phone] or if no answer [phone] 11/05/2016, 5:20 PM

## 2016-11-05 NOTE — Progress Notes (Signed)
1830 called Elink MD regarding hypotension. CVP 6. Orders given for 1L bolus NS. Will continue to monitor.

## 2016-11-05 NOTE — Procedures (Signed)
Extubation Procedure Note  Patient Details:   Name: [name] DOB: 02-Aug-1930 MRN: [id number]   Airway Documentation:  Airway 7.5 mm (Active)  Secured at (cm) 19 cm 11/05/2016  7:47 AM  Measured From Lips 11/05/2016  7:47 AM  [place] 11/05/2016  7:47 AM  Secured By [employer] 11/05/2016  7:47 AM  Tube Holder Repositioned Yes 11/05/2016  7:47 AM  Cuff Pressure (cm H2O) 28 cm H2O 11/04/2016  3:05 AM  Site Condition Dry 11/05/2016  7:47 AM    Evaluation  O2 sats: stable throughout Complications: No apparent complications Patient did tolerate procedure well. Bilateral Breath Sounds: Clear, Diminished   No   Patient extubated to a 4L [place]. Cuff leak was heard. No stridor noted. RN at bedside with RT during extubation.  Renato Gails Hodge Stachnik 11/05/2016, 10:09 AM

## 2016-11-06 ENCOUNTER — Inpatient Hospital Stay (HOSPITAL_COMMUNITY): Payer: Medicare Other

## 2016-11-06 LAB — GLUCOSE, CAPILLARY
GLUCOSE-CAPILLARY: 104 mg/dL — AB (ref 65–99)
GLUCOSE-CAPILLARY: 129 mg/dL — AB (ref 65–99)
GLUCOSE-CAPILLARY: 131 mg/dL — AB (ref 65–99)
Glucose-Capillary: 99 mg/dL (ref 65–99)
Glucose-Capillary: 103 mg/dL — ABNORMAL HIGH (ref 65–99)
Glucose-Capillary: 113 mg/dL — ABNORMAL HIGH (ref 65–99)

## 2016-11-06 LAB — BASIC METABOLIC PANEL
ANION GAP: 7 (ref 5–15)
BUN: 14 mg/dL (ref 6–20)
CHLORIDE: 114 mmol/L — AB (ref 101–111)
CO2: 25 mmol/L (ref 22–32)
Calcium: 8.5 mg/dL — ABNORMAL LOW (ref 8.9–10.3)
Creatinine, Ser: 0.6 mg/dL (ref 0.44–1.00)
GFR calc Af Amer: >60 mL/min (ref >60)
GFR calc non Af Amer: >60 mL/min (ref >60)
GLUCOSE: 95 mg/dL (ref 65–99)
POTASSIUM: 3.6 mmol/L (ref 3.5–5.1)
Sodium: 146 mmol/L — ABNORMAL HIGH (ref 135–145)

## 2016-11-06 LAB — CBC
HCT: 30.5 % — ABNORMAL LOW (ref 36.0–46.0)
Hemoglobin: 9.8 g/dL — ABNORMAL LOW (ref 12.0–15.0)
MCH: 30.8 pg (ref 26.0–34.0)
MCHC: 32.1 g/dL (ref 30.0–36.0)
MCV: 95.9 fL (ref 78.0–100.0)
Platelets: 242 10*3/uL (ref 150–400)
RBC: 3.18 MIL/uL — ABNORMAL LOW (ref 3.87–5.11)
RDW: 15.1 % (ref 11.5–15.5)
WBC: 8.5 10*3/uL (ref 4.0–10.5)

## 2016-11-06 LAB — MAGNESIUM: Magnesium: 2.1 mg/dL (ref 1.7–2.4)

## 2016-11-06 MED ORDER — CALCIUM GLUCONATE 10 % IV SOLN
2.0000 g | Freq: Once | INTRAVENOUS | Status: DC
  Filled 2016-11-06: qty 20

## 2016-11-06 MED ORDER — ACD FORMULA A 0.73-2.45-2.2 GM/100ML VI SOLN
500.0000 mL | Status: DC
  Filled 2016-11-06 (×2): qty 500

## 2016-11-06 MED ORDER — SODIUM CHLORIDE 0.9 % IV SOLN
2.0000 g | Freq: Once | INTRAVENOUS | Status: AC
  Administered 2016-11-06: 2 g via INTRAVENOUS
  Filled 2016-11-06: qty 20

## 2016-11-06 MED ORDER — HEPARIN SODIUM (PORCINE) 1000 UNIT/ML IJ SOLN
1000.0000 [IU] | Freq: Once | INTRAMUSCULAR | Status: DC
  Filled 2016-11-06: qty 1

## 2016-11-06 MED ORDER — ACD FORMULA A 0.73-2.45-2.2 GM/100ML VI SOLN
Status: AC
  Administered 2016-11-06: 500 mL
  Filled 2016-11-06: qty 500

## 2016-11-06 MED ORDER — ACETAMINOPHEN 325 MG PO TABS
650.0000 mg | ORAL_TABLET | ORAL | Status: DC | PRN
  Administered 2016-11-08: 650 mg via ORAL
  Filled 2016-11-06: qty 2

## 2016-11-06 MED ORDER — SODIUM CHLORIDE 0.9 % IV SOLN
INTRAVENOUS | Status: AC
  Administered 2016-11-06 (×3): via INTRAVENOUS_CENTRAL
  Filled 2016-11-06 (×4): qty 200

## 2016-11-06 MED ORDER — DIPHENHYDRAMINE HCL 25 MG PO CAPS
25.0000 mg | ORAL_CAPSULE | Freq: Four times a day (QID) | ORAL | Status: DC | PRN
  Administered 2016-11-08: 25 mg via ORAL
  Filled 2016-11-06: qty 1

## 2016-11-06 MED ORDER — ACD FORMULA A 0.73-2.45-2.2 GM/100ML VI SOLN
Status: AC
  Filled 2016-11-06: qty 500

## 2016-11-06 MED ORDER — SODIUM CHLORIDE 0.9% FLUSH
10.0000 mL | Freq: Two times a day (BID) | INTRAVENOUS | Status: DC
  Administered 2016-11-07 – 2016-11-10 (×6): 10 mL
  Administered 2016-11-11: 20 mL
  Administered 2016-11-11 – 2016-11-13 (×3): 10 mL

## 2016-11-06 MED ORDER — SODIUM CHLORIDE 0.9% FLUSH: 10.0000 mL | INTRAVENOUS | Status: DC | PRN

## 2016-11-06 MED ORDER — CHLORHEXIDINE GLUCONATE CLOTH 2 % EX PADS
6.0000 | MEDICATED_PAD | Freq: Every day | CUTANEOUS | Status: DC
  Administered 2016-11-07 – 2016-11-12 (×5): 6 via TOPICAL

## 2016-11-06 NOTE — Progress Notes (Signed)
PT Cancellation Note  Patient Details Name: [name] MRN: [id number] DOB: December 31, 1930   Cancelled Treatment:    Reason Eval/Treat Not Completed: Patient at procedure or test/unavailable. Will check back at next available date for readiness to participate in PT eval.   [name] 11/06/2016, 2:32 PM   Rolinda Roan, PT, DPT Acute Rehabilitation Services Pager: [phone]

## 2016-11-06 NOTE — Progress Notes (Signed)
Reason for consult:   Subjective: Patient was reintubated last night, alert and following commands.   ROS: negative except above  Vital signs in last 24 hours: Temp:  [97.6 F (36.4 C)-99.1 F (37.3 C)] 98.7 F (37.1 C) (08/15 1540) Pulse Rate:  [64-94] 93 (08/15 1400) Resp:  [12-23] 15 (08/15 1400) BP: (69-137)/(41-110) 106/58 (08/15 1540) SpO2:  [96 %-100 %] 100 % (08/15 1605) FiO2 (%):  [40 %-90 %] 40 % (08/15 1605) Weight:  [61.5 kg (135 lb 9.3 oz)-64.7 kg (142 lb 10.2 oz)] 64.7 kg (142 lb 10.2 oz) (08/15 1540)  Gen: comfortable, intubated RR: intubated, no respiratory distress  Neuro: MS: alert, follows commands CN:no facial assymetry, pupils equal and reactive Motor:neck flexion weak, UE both 4/5 and LE bilaterally 4/5 Sensation: Intact to light touch.  DTRs:2+, symmetric  Toes downgoing bilaterally.   Basic Metabolic Panel:  Recent Labs Lab 11/02/16 0534 11/02/16 0901 11/02/16 1645 11/03/16 0439 11/04/16 0500 11/05/16 0324 11/06/16 0407  NA 147* 150*  --  150* 147* 143 146*  K 3.1* 2.8*  --  3.1* 3.2* 3.5 3.6  CL 123* 119*  --  126* 118* 114* 114*  CO2 16*  --   --  19* 23 25 25   GLUCOSE 105* 121*  --  109* 108* 111* 95  BUN 9 8  --  12 16 20 14   CREATININE 0.60 0.40*  --  0.54 0.48 0.54 0.60  CALCIUM 8.4*  --   --  8.7* 8.8* 8.1* 8.5*  MG 1.9  --  2.0 2.0 1.9 1.6* 2.1  PHOS 2.0*  --  3.1 2.4* 3.4 3.1  --     CBC:  Recent Labs Lab 11/01/16 0310  11/02/16 0534 11/02/16 0901 11/03/16 0439 11/04/16 0500 11/06/16 0407  WBC 8.8  --  7.9  --  7.5 7.0 8.5  HGB 10.2*  < > 10.4* 9.2* 9.8* 9.4* 9.8*  HCT 31.2*  < > 32.6* 27.0* 29.4* 27.9* 30.5*  MCV 94.5  --  94.8  --  93.3 93.6 95.9  PLT 124*  --  130*  --  155 160 242  < > = values in this interval not displayed.   Coagulation Studies:  Recent Labs  11/04/16 0500  LABPROT 14.3  INR 1.10        ASSESSMENT AND PLAN  83 year old female with myasthenia gravis is presenting with  respiratory failure. Initial presentation was unclear given the character of her respiratory failure. She hadprogressive dysphagiashortly before her abrupt worsening, and I suspect that she may have had an aspiration event secondary to myasthenic flare causing a mixed picture of her respiratory failure.   Recommendations: 1) plasma exchange, 5/5 last dose. Still weak, may consider course of IVIG 2) prednisone 20mg  daily.(Increased from home dose of 5 mg daily)    Karena Addison Montrelle Eddings MD [facility] [phone]  If 7pm to 7am, please call on call as listed on AMION.  Spent 15 min in the management of this critically ill patient.

## 2016-11-06 NOTE — Progress Notes (Signed)
PULMONARY / CRITICAL CARE MEDICINE   Name: [name] MRN: [id number] DOB: 1931-02-21    ADMISSION DATE:  10/29/2016 CONSULTATION DATE:  10/29/16  REFERRING MD:  Dr. Laverta Baltimore / EDP   CHIEF COMPLAINT:  SOB  HISTORY OF PRESENT ILLNESS:   83 y/o F who presented to [facility] on 8/7 via EMS from a SNF with reports of increasing shortness of breath and confusion.  Hx Myasthenia Gravis (baseline prednisone 5mg , followed by Dr. Jaynee Eagles) and dementia > lives in a memory care unit.   She was combative and hypoxic with EMS who apparently attempted nasal intubation which was unsuccessful. Saturations were in the 70's on NRB.  She also vomited in route with concern for possible aspiration.  BVM ventilation provided until arrival to ER. On arrival to ER, she unresponsive and was intubated for airway protection.   STUdies  Echo 8/10 nml EF, RVSP 48  CULTURES:  BCx2 8/7 >> ng UC 8/7 >> ng  ANTIBIOTICS: Vanco 8/7 >> 8/10 Zosyn 8/7 >> 8/13  SIGNIFICANT EVENTS: 8/07  Admit with SOB, intubated   LINES/TUBES: ETT 8/7 >> 8/14; 8/14 >>  Rt IJ HD cath 8/8 >>  SUBJECTIVE: Failed extubation 8/14, reintubated Off sedation currently  VITAL SIGNS: BP (!) 137/110   Pulse 85   Temp 99.1 F (37.3 C) (Axillary)   Resp 18   Ht 5\' 1"  (1.549 m)   Wt 61.5 kg (135 lb 9.3 oz)   SpO2 99%   BMI 25.62 kg/m   HEMODYNAMICS: CVP:  [7 mmHg-24 mmHg] 24 mmHg  VENTILATOR SETTINGS: Vent Mode: PRVC FiO2 (%):  [50 %-100 %] 50 % Set Rate:  [12 bmp] 12 bmp Vt Set:  [500 mL] 500 mL PEEP:  [5 cmH20] 5 cmH20 Plateau Pressure:  [16 cmH20-25 cmH20] 16 cmH20  INTAKE / OUTPUT: I/O last 3 completed shifts: In: 3055.9 [I.V.:105.9; NG/GT:1850; IV Piggyback:1100] Out: 5784 [Urine:7475]  PHYSICAL EXAMINATION:. Gen:  Elderly ill-appearing woman, ventilated HEENT: ET tube in place, pupils equal, difficulty managing her oral secretions Lungs: No wheeze, no crackles CV:  Regular, no murmur Abd: Soft,  non-tender, positive bowel sounds Ext: No lower extremity edema Skin: Significant bruising and some dressings on skin tears bilateral upper extremities Neuro: Nods to questions, follows commands, good upper extremity strength but note oral secretions  LABS:  BMET  Recent Labs Lab 11/04/16 0500 11/05/16 0324 11/06/16 0407  NA 147* 143 146*  K 3.2* 3.5 3.6  CL 118* 114* 114*  CO2 23 25 25   BUN 16 20 14   CREATININE 0.48 0.54 0.60  GLUCOSE 108* 111* 95    Electrolytes  Recent Labs Lab 11/03/16 0439 11/04/16 0500 11/05/16 0324 11/06/16 0407  CALCIUM 8.7* 8.8* 8.1* 8.5*  MG 2.0 1.9 1.6* 2.1  PHOS 2.4* 3.4 3.1  --     CBC  Recent Labs Lab 11/03/16 0439 11/04/16 0500 11/06/16 0407  WBC 7.5 7.0 8.5  HGB 9.8* 9.4* 9.8*  HCT 29.4* 27.9* 30.5*  PLT 155 160 242    Coag's  Recent Labs Lab 11/04/16 0500  INR 1.10    Sepsis Markers  Recent Labs Lab 10/31/16 0341  PROCALCITON 6.78    ABG  Recent Labs Lab 10/31/16 0332 11/05/16 1824  PHART 7.496* 7.467*  PCO2ART 27.8* 38.6  PO2ART 58.5* 118.0*    Liver Enzymes No results for input(s): AST, ALT, ALKPHOS, BILITOT, ALBUMIN in the last 168 hours.  Cardiac Enzymes  Recent Labs Lab 10/31/16 0944 10/31/16 1843 10/31/16 2047  TROPONINI 0.06* 0.05* 0.05*    Glucose  Recent Labs Lab 11/05/16 1325 11/05/16 1623 11/05/16 1957 11/05/16 2321 11/06/16 0410 11/06/16 0755  GLUCAP 139* 132* 77 76 99 103*   Imaging Dg Chest Port 1 View  Result Date: 11/06/2016 CLINICAL DATA:  Acute respiratory failure. EXAM: PORTABLE CHEST 1 VIEW COMPARISON:  Radiograph of November 05, 2016. FINDINGS: Stable cardiomediastinal silhouette. Endotracheal tube is seen with distal tip 1 cm above the carina. Nasogastric tube is seen in expected position of proximal stomach. Right internal jugular catheter seen with distal tip in expected position of the SVC. No pneumothorax is noted. Bibasilar atelectasis or infiltrates are  noted with probable associated pleural effusions. Degenerative changes seen involving both glenohumeral joints. IMPRESSION: Endotracheal tube tip is 1 cm above the carina; withdrawal by 1-2 cm is recommended for optimal positioning. Otherwise stable support apparatus. Stable bibasilar opacities as described above. Electronically Signed   By: Marijo Conception, M.D.   On: 11/06/2016 07:58   Portable Chest Xray  Result Date: 11/05/2016 CLINICAL DATA:  ETT placement, OG tube placement EXAM: PORTABLE CHEST 1 VIEW COMPARISON:  11/05/2016 FINDINGS: Endotracheal tube is in the lower trachea approximately 1.7 cm above the carina, directed toward the right mainstem bronchus. OG tube tip is in the fundus of the stomach. Right central line tip is at the cavoatrial junction. Mild cardiomegaly with vascular congestion and bibasilar opacities, likely atelectasis. Suspect layering effusions. IMPRESSION: Endotracheal tube 1.7 cm above the carina directed toward the right mainstem bronchus. This could be pulled back 1-2 cm for optimal positioning. Continued cardiomegaly, vascular congestion, layering effusions and bibasilar atelectasis. Electronically Signed   By: Rolm Baptise M.D.   On: 11/05/2016 18:24  reviewed  STUDIES:    DISCUSSION: 83 y/o F with reported MG on prednisone and advanced dementia (per report, lives in a memory care unit) who was admitted 8/7 with increased SOB.  EMS attempted intubation nasally > pt vomited, concern for aspiration.  Intubated in ER.  Started plasmapheresis 8/9 for myasthenia flare  ASSESSMENT / PLAN:  PULMONARY A: Acute Hypoxic Respiratory Failure, multifactorial due to my sending a flare plus possible aspiration event Aspiration pneumonia P:   Continue to follow NIF and VC PSV as she can tolerate - principal reason for failing extubation 8/14 was poor airway protection and secretion management We would need to decide if she would be a tracheostomy candidate if we do not see  signs of neurological, respiratory progress Minimize sedating medications  CARDIOVASCULAR A:  Demand ischemia, peak Tp 0.06 Cardiogenic pulm edema P:  Defer diuresis 8/15. Dosing diuretics daily Aspirin  RENAL A: Mild Elevation of Lactic Acid. Resolved Hypokalemia /hypophos ? refeeding hypernatremia P:   Increased free water 8/13 Follow BMP, urine output Replace electrolytes as indicated Avoid nephrotoxins, ensure renal perfusion  GASTROINTESTINAL A:   Nausea / Vomiting - suspect related to nasal intubation attempt  P:   Reinitiate tube feedings Zofran if needed Pantoprazole for prophylaxis  HEMATOLOGIC A:   Mild Leukocytosis  P:  Follow CBC Heparin for DVT prophylaxis  INFECTIOUS A:   SIRS / Sepsis Aspiration  pneumonia P:   Zosyn 7 days completed on 8/13  ENDOCRINE A:   Hypothyroidism  Hyperglycemia    P:   Continue Synthroid Sliding scale insulin and CBG per protocol  NEUROLOGIC A:   Myasthenia Gravis - on baseline prednisone 5mg  QD, followed by Dr. Jaynee Eagles  Plasmapheresis for MG flare P:   RASS goal: 0 to -1  Follow serial neuro exam, NIF, vital capacity Continue prednisone 20 mg daily Completed 5 days of plasmapheresis on 8/14 Minimize all sedation Appreciate neurology assistance   FAMILY  - Updates: Reviewed patient's status with her healthcare power of attorney on 8/14. At this point all aggressive care to hopefully reverse acute process is appropriate. She did note that the patient would not want indefinite invasive support. We will have to revisit goals if we do not see improvement over the next several days.  - Inter-disciplinary family meet or Palliative Care meeting due by: 8/16  Independent CC time 49 minutes  Baltazar Apo, MD, PhD 11/06/2016, 10:29 AM [facility] [phone] or if no answer [phone]

## 2016-11-06 NOTE — Progress Notes (Signed)
Patient had a NIF of -18 and a VC of .225L with decent effort.

## 2016-11-06 NOTE — Progress Notes (Signed)
Cortrak Tube Team Note:  Consult received to place a Cortrak feeding tube.   A 10 F Cortrak tube was placed in the right nare and secured with a nasal bridle at 73 cm. Per the Cortrak monitor reading the tube tip is post pyloric.   No x-ray is required. RN may begin using tube.  If the tube becomes dislodged please keep the tube and contact the Cortrak team at [email or web address] (password [email or web address]) for replacement.  If after hours and replacement cannot be delayed, place a NG tube and confirm placement with an abdominal x-ray.    Mariana Single RD, LDN Clinical Nutrition Pager # [phone]

## 2016-11-07 ENCOUNTER — Inpatient Hospital Stay (HOSPITAL_COMMUNITY): Payer: Medicare Other

## 2016-11-07 LAB — POCT I-STAT, CHEM 8
BUN: 19 mg/dL (ref 6–20)
CHLORIDE: 108 mmol/L (ref 101–111)
CREATININE: 0.5 mg/dL (ref 0.44–1.00)
Calcium, Ion: 1.23 mmol/L (ref 1.15–1.40)
GLUCOSE: 146 mg/dL — AB (ref 65–99)
HEMATOCRIT: 29 % — AB (ref 36.0–46.0)
HEMOGLOBIN: 9.9 g/dL — AB (ref 12.0–15.0)
POTASSIUM: 3.8 mmol/L (ref 3.5–5.1)
Sodium: 145 mmol/L (ref 135–145)
TCO2: 24 mmol/L (ref 0–100)

## 2016-11-07 LAB — GLUCOSE, CAPILLARY
GLUCOSE-CAPILLARY: 107 mg/dL — AB (ref 65–99)
GLUCOSE-CAPILLARY: 110 mg/dL — AB (ref 65–99)
GLUCOSE-CAPILLARY: 113 mg/dL — AB (ref 65–99)
Glucose-Capillary: 114 mg/dL — ABNORMAL HIGH (ref 65–99)
Glucose-Capillary: 112 mg/dL — ABNORMAL HIGH (ref 65–99)
Glucose-Capillary: 159 mg/dL — ABNORMAL HIGH (ref 65–99)

## 2016-11-07 LAB — BASIC METABOLIC PANEL
Anion gap: 4 — ABNORMAL LOW (ref 5–15)
BUN: 15 mg/dL (ref 6–20)
CHLORIDE: 120 mmol/L — AB (ref 101–111)
CO2: 21 mmol/L — AB (ref 22–32)
CREATININE: 0.46 mg/dL (ref 0.44–1.00)
Calcium: 7.4 mg/dL — ABNORMAL LOW (ref 8.9–10.3)
GFR calc Af Amer: >60 mL/min (ref >60)
GFR calc non Af Amer: >60 mL/min (ref >60)
GLUCOSE: 124 mg/dL — AB (ref 65–99)
POTASSIUM: 2.8 mmol/L — AB (ref 3.5–5.1)
SODIUM: 145 mmol/L (ref 135–145)

## 2016-11-07 MED ORDER — PREDNISONE 10 MG PO TABS
10.0000 mg | ORAL_TABLET | Freq: Every day | ORAL | Status: DC
  Administered 2016-11-08 – 2016-11-11 (×4): 10 mg
  Filled 2016-11-07 (×4): qty 1

## 2016-11-07 MED ORDER — SODIUM CHLORIDE 0.9 % IV SOLN
INTRAVENOUS | Status: DC
  Administered 2016-11-07 – 2016-11-12 (×3): via INTRAVENOUS

## 2016-11-07 MED ORDER — SODIUM CHLORIDE 0.9 % IV BOLUS (SEPSIS)
500.0000 mL | Freq: Once | INTRAVENOUS | Status: AC
  Administered 2016-11-07: 500 mL via INTRAVENOUS

## 2016-11-07 MED ORDER — PYRIDOSTIGMINE BROMIDE 60 MG PO TABS
30.0000 mg | ORAL_TABLET | Freq: Four times a day (QID) | ORAL | Status: DC
  Administered 2016-11-07 – 2016-11-12 (×21): 30 mg via NASOGASTRIC
  Filled 2016-11-07 (×21): qty 1

## 2016-11-07 MED ORDER — POTASSIUM CHLORIDE 20 MEQ PO PACK
40.0000 meq | PACK | ORAL | Status: AC
  Administered 2016-11-07 (×2): 40 meq
  Filled 2016-11-07 (×3): qty 2

## 2016-11-07 MED ORDER — POTASSIUM CHLORIDE 10 MEQ/100ML IV SOLN
10.0000 meq | INTRAVENOUS | Status: AC
  Administered 2016-11-07 (×6): 10 meq via INTRAVENOUS
  Filled 2016-11-07 (×6): qty 100

## 2016-11-07 MED ORDER — FUROSEMIDE 10 MG/ML IJ SOLN
40.0000 mg | Freq: Once | INTRAMUSCULAR | Status: AC
  Administered 2016-11-07: 40 mg via INTRAVENOUS
  Filled 2016-11-07: qty 4

## 2016-11-07 NOTE — Progress Notes (Signed)
Nif -30  VC 300 with good pt effort

## 2016-11-07 NOTE — Evaluation (Signed)
Physical Therapy Evaluation Patient Details Name: [name] MRN: [id number] DOB: 1930/07/14 Today's Date: 11/07/2016   History of Present Illness  Pt is an 83 y/o female with a significant PMH of myathenia gravis and dementia. She presented from SNF (memory care unit) with increasing SOB and confusion. She was intubated in the ED. Pt was admitted for myasthenia flare and aspiration PNA.   Clinical Impression  Pt admitted with above diagnosis. Pt currently with functional limitations due to the deficits listed below (see PT Problem List). At the time of PT eval pt was able to follow commands fairly well and answered questions with hand gestures and head nods. She tolerated sitting EOB for ~30 seconds. Appears that pt was residing in a SNF memory care unit PTA but unsure of true PLOF. Pt will benefit from skilled PT to increase their independence and safety with mobility to allow discharge to the venue listed below.       Follow Up Recommendations SNF;Supervision/Assistance - 24 hour    Equipment Recommendations  None recommended by PT    Recommendations for Other Services OT consult     Precautions / Restrictions Precautions Precautions: Fall Precaution Comments: Intubated Restrictions Weight Bearing Restrictions: No      Mobility  Bed Mobility Overal bed mobility: Needs Assistance Bed Mobility: Supine to Sit;Sit to Supine     Supine to sit: Mod assist;+2 for physical assistance;HOB elevated Sit to supine: Total assist;+2 for physical assistance   General bed mobility comments: Pt able to initiate and advance LE's towards EOB. Was not able to reach across her torso for the railing, however once closer she did make an effort to hold on to the railing. She tolerated sitting EOB for ~30 seconds and began severely gagging and attempting to cough. Pt was returned to supine and excess saliva was suctioned out of her mouth.   Transfers                 General transfer  comment: Unable  Ambulation/Gait             General Gait Details: Unable  Stairs            Wheelchair Mobility    Modified Rankin (Stroke Patients Only)       Balance Overall balance assessment: Needs assistance Sitting-balance support: Bilateral upper extremity supported;Feet supported Sitting balance-Leahy Scale: Poor Sitting balance - Comments: Min-mod assist required for static sitting balance EOB                                     Pertinent Vitals/Pain Pain Assessment: Faces Faces Pain Scale: Hurts little more Pain Location: Discomfort from tube in her mouth from vent Pain Descriptors / Indicators: Discomfort Pain Intervention(s): Monitored during session    Home Living Family/patient expects to be discharged to:: Skilled nursing facility (Memory Care Unit)                      Prior Function Level of Independence: Needs assistance         Comments: Pt unable to convey, however it is likely that she required assist for ADL's and possibly mobility PTA.      Hand Dominance        Extremity/Trunk Assessment   Upper Extremity Assessment Upper Extremity Assessment: Generalized weakness    Lower Extremity Assessment Lower Extremity Assessment: Generalized weakness    Cervical /  Trunk Assessment Cervical / Trunk Assessment: Kyphotic  Communication   Communication: Other (comment) (Intubated)  Cognition Arousal/Alertness: Awake/alert Behavior During Therapy: Anxious;Restless Overall Cognitive Status: Difficult to assess                                        General Comments      Exercises     Assessment/Plan    PT Assessment Patient needs continued PT services  PT Problem List Decreased activity tolerance;Decreased balance;Decreased mobility;Decreased strength;Decreased cognition;Decreased knowledge of use of DME;Decreased safety awareness;Decreased knowledge of precautions;Cardiopulmonary  status limiting activity       PT Treatment Interventions DME instruction;Gait training;Functional mobility training;Stair training;Therapeutic activities;Therapeutic exercise;Neuromuscular re-education;Patient/family education    PT Goals (Current goals can be found in the Care Plan section)  Acute Rehab PT Goals PT Goal Formulation: Patient unable to participate in goal setting Time For Goal Achievement: 11/21/16 Potential to Achieve Goals: Fair    Frequency Min 3X/week   Barriers to discharge        Co-evaluation               AM-PAC PT "6 Clicks" Daily Activity  Outcome Measure Difficulty turning over in bed (including adjusting bedclothes, sheets and blankets)?: Total Difficulty moving from lying on back to sitting on the side of the bed? : Total Difficulty sitting down on and standing up from a chair with arms (e.g., wheelchair, bedside commode, etc,.)?: Total Help needed moving to and from a bed to chair (including a wheelchair)?: Total Help needed walking in hospital room?: Total Help needed climbing 3-5 steps with a railing? : Total 6 Click Score: 6    End of Session Equipment Utilized During Treatment: Oxygen Activity Tolerance: Other (comment) (Limited by gagging and anxiety) Patient left: in bed;with call bell/phone within reach;with bed alarm set Nurse Communication: Mobility status PT Visit Diagnosis: Muscle weakness (generalized) (M62.81);Other abnormalities of gait and mobility (R26.89)    Time: 4709-6283 PT Time Calculation (min) (ACUTE ONLY): 28 min   Charges:   PT Evaluation $PT Eval Moderate Complexity: 1 Mod PT Treatments $Therapeutic Activity: 8-22 mins   PT G Codes:        Rolinda Roan, PT, DPT Acute Rehabilitation Services Pager: [phone]   [name] 11/07/2016, 2:04 PM

## 2016-11-07 NOTE — Progress Notes (Signed)
Reason for consult:   Subjective: She continues to be intubated.   ROS: negative except above   Examination  Vital signs in last 24 hours: Temp:  [98.2 F (36.8 C)-98.7 F (37.1 C)] 98.6 F (37 C) (08/16 2000) Pulse Rate:  [65-98] 77 (08/16 2100) Resp:  [12-29] 14 (08/16 2100) BP: (93-135)/(40-68) 98/43 (08/16 2100) SpO2:  [96 %-100 %] 96 % (08/16 2100) FiO2 (%):  [40 %] 40 % (08/16 2017) Weight:  [62.4 kg (137 lb 9.1 oz)] 62.4 kg (137 lb 9.1 oz) (08/16 0403)  Gen: comfortable, intubated RR: intubated, no respiratory distress  Neuro: MS: alert, follows commands CN:no facial assymetry, pupils equal and reactive Motor:neck flexion weak, UE both 4/5 and LE bilaterally 4/5 Sensation: Intact to light touch.  DTRs:2+, symmetric Toes downgoing bilaterally.    Basic Metabolic Panel:  Recent Labs Lab 11/02/16 0534  11/02/16 1645 11/03/16 0439 11/04/16 0500 11/05/16 0324 11/06/16 0407 11/06/16 1353 11/07/16 0410  NA 147*  < >  --  150* 147* 143 146* 145 145  K 3.1*  < >  --  3.1* 3.2* 3.5 3.6 3.8 2.8*  CL 123*  < >  --  126* 118* 114* 114* 108 120*  CO2 16*  --   --  19* 23 25 25   --  21*  GLUCOSE 105*  < >  --  109* 108* 111* 95 146* 124*  BUN 9  < >  --  12 16 20 14 19 15   CREATININE 0.60  < >  --  0.54 0.48 0.54 0.60 0.50 0.46  CALCIUM 8.4*  --   --  8.7* 8.8* 8.1* 8.5*  --  7.4*  MG 1.9  --  2.0 2.0 1.9 1.6* 2.1  --   --   PHOS 2.0*  --  3.1 2.4* 3.4 3.1  --   --   --   < > = values in this interval not displayed.  CBC:  Recent Labs Lab 11/01/16 0310  11/02/16 0534 11/02/16 0901 11/03/16 0439 11/04/16 0500 11/06/16 0407 11/06/16 1353  WBC 8.8  --  7.9  --  7.5 7.0 8.5  --   HGB 10.2*  < > 10.4* 9.2* 9.8* 9.4* 9.8* 9.9*  HCT 31.2*  < > 32.6* 27.0* 29.4* 27.9* 30.5* 29.0*  MCV 94.5  --  94.8  --  93.3 93.6 95.9  --   PLT 124*  --  130*  --  155 160 242  --   < > = values in this interval not displayed.   Coagulation Studies: No results for  input(s): LABPROT, INR in the last 72 hours.  NIF - 58    ASSESSMENT AND PLAN   83 year old female with myasthenia gravis is presenting with respiratory failure. Initial presentation was unclear given the character of her respiratory failure. She hadprogressive dysphagiashortly before her abrupt worsening, and I suspect that she may have had an aspiration event secondary to myasthenic flare causing a mixed picture of her respiratory failure.   The patient is improving. Next flexor strength is better and 3+ to 4/5  Recommendations: 1) plasma exchange, 5/5 last dose yesterday.  2) will decrease prednisone from 20 mg to 10 mg as steroids acutely worsen MG 3) will start the patient on Mestinon q6h.  Continue to watch, if she does not improve we will start IVIG tomorrow.    Karena Addison Shamere Campas MD [facility] [phone]  If 7pm to 7am, please call on call as listed on AMION.  Spent 15 min in the management of this critically ill patient.

## 2016-11-07 NOTE — Progress Notes (Signed)
PULMONARY / CRITICAL CARE MEDICINE   Name: [name] MRN: [id number] DOB: 11/23/1930    ADMISSION DATE:  10/29/2016 CONSULTATION DATE:  10/29/16  REFERRING MD:  Dr. Laverta Baltimore / EDP   CHIEF COMPLAINT:  SOB  HISTORY OF PRESENT ILLNESS:   83 y/o F who presented to [facility] on 8/7 via EMS from a SNF with reports of increasing shortness of breath and confusion.  Hx Myasthenia Gravis (baseline prednisone 5mg , followed by Dr. Jaynee Eagles) and dementia > lives in a memory care unit.   She was combative and hypoxic with EMS who apparently attempted nasal intubation which was unsuccessful. Saturations were in the 70's on NRB.  She also vomited in route with concern for possible aspiration.  BVM ventilation provided until arrival to ER. On arrival to ER, she unresponsive and was intubated for airway protection.   STUdies  Echo 8/10 nml EF, RVSP 48  CULTURES:  BCx2 8/7 >> ng UC 8/7 >> ng  ANTIBIOTICS: Vanco 8/7 >> 8/10 Zosyn 8/7 >> 8/13  SIGNIFICANT EVENTS: 8/07  Admit with SOB, intubated   LINES/TUBES: ETT 8/7 >> 8/14; 8/14 >>  Rt IJ HD cath 8/8 >>  SUBJECTIVE: Patient had vital capacity 450, NIF -20 Tolerating pressure support 10 currently No new issues overnight  VITAL SIGNS: BP (!) 121/50   Pulse 86   Temp 98.2 F (36.8 C) (Axillary)   Resp (!) 22   Ht 5\' 1"  (1.549 m)   Wt 62.4 kg (137 lb 9.1 oz)   SpO2 99%   BMI 25.99 kg/m   HEMODYNAMICS: CVP:  [7 mmHg-13 mmHg] 13 mmHg  VENTILATOR SETTINGS: Vent Mode: PSV;CPAP FiO2 (%):  [40 %] 40 % Set Rate:  [12 bmp] 12 bmp Vt Set:  [500 mL] 500 mL PEEP:  [5 cmH20] 5 cmH20 Pressure Support:  [10 cmH20] 10 cmH20 Plateau Pressure:  [18 cmH20-19 cmH20] 18 cmH20  INTAKE / OUTPUT: I/O last 3 completed shifts: In: 2948.3 [I.V.:157.5; NG/GT:1790.8; IV Piggyback:1000] Out: 900 [Urine:900]  PHYSICAL EXAMINATION:. Gen:  Elderly ill-appearing woman, ventilated HEENT: ET tube in place, pupils equal, difficulty managing her  oral secretions Lungs: No wheeze, no crackles CV:  Regular, no murmur Abd: Soft, non-tender, positive bowel sounds Ext: No lower extremity edema Skin: Significant bruising and some dressings on skin tears bilateral upper extremities Neuro: Nods to questions, follows commands, good upper extremity strength but note oral secretions  LABS:  BMET  Recent Labs Lab 11/05/16 0324 11/06/16 0407 11/06/16 1353 11/07/16 0410  NA 143 146* 145 145  K 3.5 3.6 3.8 2.8*  CL 114* 114* 108 120*  CO2 25 25  --  21*  BUN 20 14 19 15   CREATININE 0.54 0.60 0.50 0.46  GLUCOSE 111* 95 146* 124*    Electrolytes  Recent Labs Lab 11/03/16 0439 11/04/16 0500 11/05/16 0324 11/06/16 0407 11/07/16 0410  CALCIUM 8.7* 8.8* 8.1* 8.5* 7.4*  MG 2.0 1.9 1.6* 2.1  --   PHOS 2.4* 3.4 3.1  --   --     CBC  Recent Labs Lab 11/03/16 0439 11/04/16 0500 11/06/16 0407 11/06/16 1353  WBC 7.5 7.0 8.5  --   HGB 9.8* 9.4* 9.8* 9.9*  HCT 29.4* 27.9* 30.5* 29.0*  PLT 155 160 242  --     Coag's  Recent Labs Lab 11/04/16 0500  INR 1.10    Sepsis Markers No results for input(s): LATICACIDVEN, PROCALCITON, O2SATVEN in the last 168 hours.  ABG  Recent Labs Lab 11/05/16  1824  PHART 7.467*  PCO2ART 38.6  PO2ART 118.0*    Liver Enzymes No results for input(s): AST, ALT, ALKPHOS, BILITOT, ALBUMIN in the last 168 hours.  Cardiac Enzymes  Recent Labs Lab 10/31/16 1843 10/31/16 2047  TROPONINI 0.05* 0.05*    Glucose  Recent Labs Lab 11/06/16 1616 11/06/16 1943 11/06/16 2348 11/07/16 0348 11/07/16 0756 11/07/16 1145  GLUCAP 129* 113* 104* 110* 114* 112*   Imaging Dg Chest Port 1 View  Result Date: 11/07/2016 CLINICAL DATA:  Acute respiratory failure EXAM: PORTABLE CHEST 1 VIEW COMPARISON:  11/06/2016 FINDINGS: Cardiac shadow is enlarged but stable. Endotracheal tube is noted at the level of the carina stable from the prior exam. Feeding catheter is noted extending into the  duodenum. Right jugular central line is seen in stable position. Poor inspiratory effort is noted with bibasilar atelectatic changes. No bony abnormality is noted. IMPRESSION: Poor inspiratory effort with bibasilar changes stable from the prior exam. Tubes and lines as described. Electronically Signed   By: Inez Catalina M.D.   On: 11/07/2016 07:17  reviewed  STUDIES:    DISCUSSION: 83 y/o F with reported MG on prednisone and advanced dementia (per report, lives in a memory care unit) who was admitted 8/7 with increased SOB.  EMS attempted intubation nasally > pt vomited, concern for aspiration.  Intubated in ER.  Started plasmapheresis 8/9 for myasthenia flare  ASSESSMENT / PLAN:  PULMONARY A: Acute Hypoxic Respiratory Failure, multifactorial due to my sending a flare plus possible aspiration event Aspiration pneumonia P:   Continue to follow NIF and vital capacity Pressure support as she can tolerate. Principal reason for failing extubation 814 appeared to be secretion management, airway protection. We will need to decide whether she would be a good tracheostomy candidate if we do not see signs of neurological improvement. I suspect based on my conversations with her healthcare power of attorney that this is not the direction that the patient would favor. Minimize sedating medications Manage myasthenia gravis as below  CARDIOVASCULAR A:  Demand ischemia, peak Tp 0.06 Cardiogenic pulm edema P:  Single dose furosemide on 8/16. Dosing diuretics daily Continue aspirin  RENAL A: Mild Elevation of Lactic Acid. Resolved Hypokalemia /hypophos ? refeeding hypernatremia P:   Continue current free water Follow BMP, urine output Replace electrolytes as indicated Avoid nephrotoxin, ensure renal perfusion  GASTROINTESTINAL A:   Nausea / Vomiting - suspect related to nasal intubation attempt  P:   Continue tube feedings Zofran if needed Pantoprazole for  prophylaxis   HEMATOLOGIC A:   Mild Leukocytosis  P:  Heparin DVT prophylaxis Follow CBC  INFECTIOUS A:   SIRS / Sepsis Aspiration  pneumonia P:   Zosyn 7 days completed on 8/13  ENDOCRINE A:   Hypothyroidism  Hyperglycemia    P:   Continue Synthroid Sliding-scale insulin and CABG per protocol  NEUROLOGIC A:   Myasthenia Gravis - on baseline prednisone 5mg  QD, followed by Dr. Jaynee Eagles  Plasmapheresis for MG flare P:   RASS goal: 0 to -1  Case discussed with neurology 8/16. We will decrease prednisone to 10 mg daily (some instance of worsening myasthenia with acute increase corticosteroids), start Mestinon today. May need to consider IVIG given her lack of improvement over the last several days. Completed 5 days plasmapheresis 8/14 Serial neurological exam, NIF, vital capacity Minimize all sedation  FAMILY  - Updates: Reviewed patient's status with her healthcare power of attorney on 8/14. At this point all aggressive care to hopefully reverse  acute process is appropriate. She did note that the patient would not want indefinite invasive support. We will have to revisit goals if we do not see improvement over the next several days.  - Inter-disciplinary family meet or Palliative Care meeting due by: 8/16  Independent CC time 10 minutes  Baltazar Apo, MD, PhD 11/07/2016, 12:35 PM [facility] [phone] or if no answer [phone]

## 2016-11-07 NOTE — Progress Notes (Signed)
eLink Physician-Brief Progress Note Patient Name: [name] DOB: Oct 02, 1930 MRN: [id number]   Date of Service  11/07/2016  HPI/Events of Note  Hypotension. BP 98/43  eICU Interventions  500cc NS bolus     Intervention Category Intermediate Interventions: Hypotension - evaluation and management  [name] 11/07/2016, 9:12 PM

## 2016-11-07 NOTE — Progress Notes (Signed)
NIF: -20, VC: 450 with decent patient effort.

## 2016-11-08 LAB — BASIC METABOLIC PANEL
ANION GAP: 4 — AB (ref 5–15)
BUN: 11 mg/dL (ref 6–20)
CALCIUM: 8.3 mg/dL — AB (ref 8.9–10.3)
CO2: 23 mmol/L (ref 22–32)
CREATININE: 0.44 mg/dL (ref 0.44–1.00)
Chloride: 114 mmol/L — ABNORMAL HIGH (ref 101–111)
GFR calc Af Amer: >60 mL/min (ref >60)
GLUCOSE: 122 mg/dL — AB (ref 65–99)
Potassium: 3.7 mmol/L (ref 3.5–5.1)
Sodium: 141 mmol/L (ref 135–145)

## 2016-11-08 LAB — GLUCOSE, CAPILLARY
GLUCOSE-CAPILLARY: 108 mg/dL — AB (ref 65–99)
GLUCOSE-CAPILLARY: 140 mg/dL — AB (ref 65–99)
GLUCOSE-CAPILLARY: 122 mg/dL — AB (ref 65–99)
Glucose-Capillary: 107 mg/dL — ABNORMAL HIGH (ref 65–99)
Glucose-Capillary: 110 mg/dL — ABNORMAL HIGH (ref 65–99)
Glucose-Capillary: 118 mg/dL — ABNORMAL HIGH (ref 65–99)

## 2016-11-08 LAB — MAGNESIUM: Magnesium: 2.2 mg/dL (ref 1.7–2.4)

## 2016-11-08 MED ORDER — IMMUNE GLOBULIN (HUMAN) 5 GM/50ML IV SOLN
400.0000 mg/kg | INTRAVENOUS | Status: DC
  Administered 2016-11-08 – 2016-11-11 (×4): 25 g via INTRAVENOUS
  Filled 2016-11-08 (×6): qty 50

## 2016-11-08 MED ORDER — SODIUM CHLORIDE 0.9% FLUSH
10.0000 mL | Freq: Two times a day (BID) | INTRAVENOUS | Status: DC
  Administered 2016-11-08: 20 mL

## 2016-11-08 MED ORDER — SODIUM CHLORIDE 0.9% FLUSH
10.0000 mL | INTRAVENOUS | Status: DC | PRN
Start: 2016-11-08 — End: 2016-11-09

## 2016-11-08 MED ORDER — CHLORHEXIDINE GLUCONATE CLOTH 2 % EX PADS
6.0000 | MEDICATED_PAD | Freq: Every day | CUTANEOUS | Status: DC
  Administered 2016-11-08: 6 via TOPICAL

## 2016-11-08 MED ORDER — ACD FORMULA A 0.73-2.45-2.2 GM/100ML VI SOLN
Status: AC
  Filled 2016-11-08: qty 500

## 2016-11-08 MED ORDER — FUROSEMIDE 10 MG/ML IJ SOLN
20.0000 mg | Freq: Once | INTRAMUSCULAR | Status: AC
  Administered 2016-11-08: 20 mg via INTRAVENOUS
  Filled 2016-11-08: qty 2

## 2016-11-08 MED ORDER — CHLORHEXIDINE GLUCONATE 0.12 % MT SOLN
OROMUCOSAL | Status: AC
  Administered 2016-11-08: 15 mL
  Filled 2016-11-08: qty 15

## 2016-11-08 MED ORDER — DEXMEDETOMIDINE HCL IN NACL 400 MCG/100ML IV SOLN
0.4000 ug/kg/h | INTRAVENOUS | Status: DC
  Administered 2016-11-08 – 2016-11-09 (×2): 0.6 ug/kg/h via INTRAVENOUS
  Administered 2016-11-09: 0.4 ug/kg/h via INTRAVENOUS
  Filled 2016-11-08 (×5): qty 100

## 2016-11-08 NOTE — Progress Notes (Signed)
Spoke to nurse regarding the need for a PICC line. She will contact the MD for the need for central access and inform the IV team via consult. Catalina Pizza

## 2016-11-08 NOTE — Progress Notes (Signed)
Dr. Lamonte Sakai OK with SBP > 90.

## 2016-11-08 NOTE — Progress Notes (Signed)
Reason for consult:   Subjective: She continues to be intubated.   ROS: negative except above   Examination  Gen: comfortable, intubated RR: intubated, no respiratory distress  Neuro: MS: alert, follows commands CN:no facial assymetry, pupils equal and reactive Motor:neck flexion weak, UE both 4/5 and LE bilaterally 4/5 Sensation: Intact to light touch.  DTRs:2+, symmetric Toes downgoing bilaterally.    Temp:  [98 F (36.7 C)-98.6 F (37 C)] 98.6 F (37 C) (08/17 1529) Pulse Rate:  [57-95] 79 (08/17 1800) Resp:  [12-26] 12 (08/17 1800) BP: (80-139)/(36-62) 91/43 (08/17 1800) SpO2:  [95 %-100 %] 98 % (08/17 1800) FiO2 (%):  [40 %] 40 % (08/17 1612) Weight:  [65.2 kg (143 lb 11.8 oz)] 65.2 kg (143 lb 11.8 oz) (08/17 0414)    ASSESSMENT AND PLAN   83 year old female with myasthenia gravis is presenting with respiratory failure. Initial presentation was unclear given the character of her respiratory failure. She hadprogressive dysphagiashortly before her abrupt worsening, and I suspect that she may have had an aspiration event secondary to myasthenic flare causing a mixed picture of her respiratory failure.   The patient is improving. Neck flexor strength is 3/5 Recommendations: 1) Will start IVIG x 5 days 2) continue steroids 10 mg 3) continue patient on Mestinon q6h.   Karena Addison Aroor MD [facility] [phone]  If 7pm to 7am, please call on call as listed on AMION.  Spent 15 min in the management of this critically ill patient.

## 2016-11-08 NOTE — Progress Notes (Signed)
NIF -20 VC 350ML.

## 2016-11-08 NOTE — Progress Notes (Signed)
Peripherally Inserted Central Catheter/Midline Placement  The IV Nurse has discussed with the patient and/or persons authorized to consent for the patient, the purpose of this procedure and the potential benefits and risks involved with this procedure.  The benefits include less needle sticks, lab draws from the catheter, and the patient may be discharged home with the catheter. Risks include, but not limited to, infection, bleeding, blood clot (thrombus formation), and puncture of an artery; nerve damage and irregular heartbeat and possibility to perform a PICC exchange if needed/ordered by physician.  Alternatives to this procedure were also discussed.  [employer] PICC patient education guide, fact sheet on infection prevention and patient information card has been provided to patient /or left at bedside.    PICC/Midline Placement Documentation     POA Dutch Quint telephone signed consent   Synthia Innocent 11/08/2016, 6:57 PM

## 2016-11-08 NOTE — Progress Notes (Signed)
PULMONARY / CRITICAL CARE MEDICINE   Name: [name] MRN: [id number] DOB: 1930-04-23    ADMISSION DATE:  10/29/2016 CONSULTATION DATE:  10/29/16  REFERRING MD:  Dr. Laverta Baltimore / EDP   CHIEF COMPLAINT:  SOB  HISTORY OF PRESENT ILLNESS:   83 y/o F who presented to [facility] on 8/7 via EMS from a SNF with reports of increasing shortness of breath and confusion.  Hx Myasthenia Gravis (baseline prednisone 5mg , followed by Dr. Jaynee Eagles) and dementia > lives in a memory care unit.   She was combative and hypoxic with EMS who apparently attempted nasal intubation which was unsuccessful. Saturations were in the 70's on NRB.  She also vomited in route with concern for possible aspiration.  BVM ventilation provided until arrival to ER. On arrival to ER, she unresponsive and was intubated for airway protection.   STUdies  Echo 8/10 nml EF, RVSP 48  CULTURES:  BCx2 8/7 >> ng UC 8/7 >> ng  ANTIBIOTICS: Vanco 8/7 >> 8/10 Zosyn 8/7 >> 8/13  SIGNIFICANT EVENTS: 8/07  Admit with SOB, intubated   LINES/TUBES: ETT 8/7 >> 8/14; 8/14 >>  Rt IJ HD cath 8/8 >>  SUBJECTIVE: Patient had vital capacity 350, NIF -25 Started on Precedex overnight  VITAL SIGNS: BP (!) 95/38   Pulse 66   Temp 98.2 F (36.8 C) (Axillary)   Resp 18   Ht 5\' 1"  (1.549 m)   Wt 65.2 kg (143 lb 11.8 oz)   SpO2 96%   BMI 27.16 kg/m   HEMODYNAMICS: CVP:  [8 mmHg-13 mmHg] 13 mmHg  VENTILATOR SETTINGS: Vent Mode: PSV;CPAP FiO2 (%):  [40 %] 40 % Set Rate:  [12 bmp] 12 bmp Vt Set:  [500 mL] 500 mL PEEP:  [5 cmH20] 5 cmH20 Pressure Support:  [10 cmH20] 10 cmH20 Plateau Pressure:  [17 cmH20-20 cmH20] 20 cmH20  INTAKE / OUTPUT: I/O last 3 completed shifts: In: 3101.3 [I.V.:351.3; NG/GT:2250; IV Piggyback:500] Out: 2775 [Urine:2775]  PHYSICAL EXAMINATION:. Gen: Elderly ill appearing woman, ventilated HEENT: ET tube in place, pupils equal Lungs: Decreased at both bases, no wheezing CV:  Regular, no  murmur Abd: Soft, nontender, positive bowel sounds Ext: No lower extremity edema Skin: Skin tears and significant bruising, dressings on bilateral upper extremities Neuro: Not to questions, follows commands, oral secretions present, strong cough, moves bilateral upper extremities  LABS:  BMET  Recent Labs Lab 11/06/16 0407 11/06/16 1353 11/07/16 0410 11/08/16 0403  NA 146* 145 145 141  K 3.6 3.8 2.8* 3.7  CL 114* 108 120* 114*  CO2 25  --  21* 23  BUN 14 19 15 11   CREATININE 0.60 0.50 0.46 0.44  GLUCOSE 95 146* 124* 122*    Electrolytes  Recent Labs Lab 11/03/16 0439 11/04/16 0500 11/05/16 0324 11/06/16 0407 11/07/16 0410 11/08/16 0403  CALCIUM 8.7* 8.8* 8.1* 8.5* 7.4* 8.3*  MG 2.0 1.9 1.6* 2.1  --  2.2  PHOS 2.4* 3.4 3.1  --   --   --     CBC  Recent Labs Lab 11/03/16 0439 11/04/16 0500 11/06/16 0407 11/06/16 1353  WBC 7.5 7.0 8.5  --   HGB 9.8* 9.4* 9.8* 9.9*  HCT 29.4* 27.9* 30.5* 29.0*  PLT 155 160 242  --     Coag's  Recent Labs Lab 11/04/16 0500  INR 1.10    Sepsis Markers No results for input(s): LATICACIDVEN, PROCALCITON, O2SATVEN in the last 168 hours.  ABG  Recent Labs Lab 11/05/16 1824  PHART 7.467*  PCO2ART 38.6  PO2ART 118.0*    Liver Enzymes No results for input(s): AST, ALT, ALKPHOS, BILITOT, ALBUMIN in the last 168 hours.  Cardiac Enzymes No results for input(s): TROPONINI, PROBNP in the last 168 hours.  Glucose  Recent Labs Lab 11/07/16 1145 11/07/16 1635 11/07/16 2029 11/07/16 2347 11/08/16 0348 11/08/16 0755  GLUCAP 112* 159* 107* 113* 108* 140*   Imaging No results found.reviewed  STUDIES:    DISCUSSION: 83 y/o F with reported MG on prednisone and advanced dementia (per report, lives in a memory care unit) who was admitted 8/7 with increased SOB.  EMS attempted intubation nasally > pt vomited, concern for aspiration.  Intubated in ER.  Started plasmapheresis 8/9 for myasthenia flare  ASSESSMENT  / PLAN:  PULMONARY A: Acute Hypoxic Respiratory Failure, multifactorial due to my sending a flare plus possible aspiration event Aspiration pneumonia P:   Continue to follow NIF and vital capacity Pressure support as she can tolerate. Her principal reason for failing 8/14 appeared to be secretion management, airway protection Based on my discussions with the patient's self-care power of attorney I suspect that she is not a tracheostomy or long-term ventilation candidate Minimize sedating medications, note Precedex started 8/16 Manage myasthenia gravis as below  CARDIOVASCULAR A:  Demand ischemia, peak Tp 0.06 Cardiogenic pulm edema P:  Dosing diuretics daily, goal I=O. Single dose lasix 8/17 Continue aspirin  RENAL A: Mild Elevation of Lactic Acid. Resolved Hypokalemia /hypophos ? refeeding hypernatremia P:   Continue free water as ordered, follow-up sodium Follow BMP, urine output Replace a lecture lites as indicated  GASTROINTESTINAL A:   Nausea / Vomiting - suspect related to nasal intubation attempt, resolved P:   Tube feedings as ordered Zofran if needed Pantoprazole for prophylaxis   HEMATOLOGIC A:   Mild Leukocytosis, resolved P:  Heparin DVT prophylaxis Follow CBC  INFECTIOUS A:   SIRS / Sepsis Aspiration  pneumonia P:   Zosyn 7 days completed on 8/13  ENDOCRINE A:   Hypothyroidism  Hyperglycemia    P:   Sliding scale insulin and CBG per protocol Continue Synthroid  NEUROLOGIC A:   Myasthenia Gravis - on baseline prednisone 5mg  QD, followed by Dr. Jaynee Eagles  Plasmapheresis for MG flare P:   RASS goal: 0 to -1  Case discussed with neurology 8/17. Prednisone down to 10 mg daily. Continue Mestinon today. Planning to start IVIG 8/17 Completed 5 days plasmapheresis 8/14 Serial neurological exam, NIF, vital capacity Favor weaning Precedex, goal to off  FAMILY  - Updates: Reviewed patient's status with her healthcare power of attorney on 8/14.  At this point all aggressive care to hopefully reverse acute process is appropriate. She did note that the patient would not want indefinite invasive support. We will have to revisit goals if we do not see improvement over the next several days.  - Inter-disciplinary family meet or Palliative Care meeting due by: 8/16  Independent CC time 55 minutes  Baltazar Apo, MD, PhD 11/08/2016, 10:26 AM [place] [phone] or if no answer [phone]

## 2016-11-09 DIAGNOSIS — Z452 Encounter for adjustment and management of vascular access device: Secondary | ICD-10-CM

## 2016-11-09 LAB — GLUCOSE, CAPILLARY
GLUCOSE-CAPILLARY: 110 mg/dL — AB (ref 65–99)
GLUCOSE-CAPILLARY: 113 mg/dL — AB (ref 65–99)
GLUCOSE-CAPILLARY: 121 mg/dL — AB (ref 65–99)
GLUCOSE-CAPILLARY: 123 mg/dL — AB (ref 65–99)
GLUCOSE-CAPILLARY: 98 mg/dL (ref 65–99)
Glucose-Capillary: 117 mg/dL — ABNORMAL HIGH (ref 65–99)

## 2016-11-09 MED ORDER — FUROSEMIDE 10 MG/ML IJ SOLN
40.0000 mg | Freq: Two times a day (BID) | INTRAMUSCULAR | Status: DC
  Administered 2016-11-10: 40 mg via INTRAVENOUS
  Filled 2016-11-09: qty 4

## 2016-11-09 MED ORDER — FUROSEMIDE 10 MG/ML IJ SOLN
20.0000 mg | Freq: Once | INTRAMUSCULAR | Status: AC
  Administered 2016-11-09: 20 mg via INTRAVENOUS
  Filled 2016-11-09: qty 2

## 2016-11-09 NOTE — Progress Notes (Signed)
NIF - 26, VC 487

## 2016-11-09 NOTE — Progress Notes (Signed)
Reason for consult:   Subjective: She continues to be intubated. Weaning well.   ROS: negative except above  Examination  Vital signs in last 24 hours: Temp:  [98.4 F (36.9 C)-99.7 F (37.6 C)] 98.9 F (37.2 C) (08/18 1609) Pulse Rate:  [60-79] 66 (08/18 1609) Resp:  [12-29] 24 (08/18 1609) BP: (86-125)/(38-64) 90/43 (08/18 1400) SpO2:  [94 %-100 %] 99 % (08/18 1609) FiO2 (%):  [40 %] 40 % (08/18 1609) Weight:  [62.9 kg (138 lb 10.7 oz)] 62.9 kg (138 lb 10.7 oz) (08/18 0500)  Gen: comfortable, intubated RR: intubated, no respiratory distress  Neuro: MS: alert, follows commands CN:no facial assymetry, pupils equal and reactive Motor:neck flexion weak, UE both 4/5 and LE bilaterally 4/5 Sensation: Intact to light touch.  DTRs:2+, symmetric Toes downgoing bilaterally.   Basic Metabolic Panel:  Recent Labs Lab 11/03/16 0439 11/04/16 0500 11/05/16 0324 11/06/16 0407 11/06/16 1353 11/07/16 0410 11/08/16 0403  NA 150* 147* 143 146* 145 145 141  K 3.1* 3.2* 3.5 3.6 3.8 2.8* 3.7  CL 126* 118* 114* 114* 108 120* 114*  CO2 19* 23 25 25   --  21* 23  GLUCOSE 109* 108* 111* 95 146* 124* 122*  BUN 12 16 20 14 19 15 11   CREATININE 0.54 0.48 0.54 0.60 0.50 0.46 0.44  CALCIUM 8.7* 8.8* 8.1* 8.5*  --  7.4* 8.3*  MG 2.0 1.9 1.6* 2.1  --   --  2.2  PHOS 2.4* 3.4 3.1  --   --   --   --     CBC:  Recent Labs Lab 11/03/16 0439 11/04/16 0500 11/06/16 0407 11/06/16 1353  WBC 7.5 7.0 8.5  --   HGB 9.8* 9.4* 9.8* 9.9*  HCT 29.4* 27.9* 30.5* 29.0*  MCV 93.3 93.6 95.9  --   PLT 155 160 242  --      Coagulation Studies: No results for input(s): LABPROT, INR in the last 72 hours.  NIF -77. 65 ML     ASSESSMENT AND PLAN   83 year old female with myasthenia gravis is presenting with respiratory failure. Initial presentation was unclear given the character of her respiratory failure. She hadprogressive dysphagiashortly before her abrupt worsening, and I  suspect that she may have had an aspiration event secondary to myasthenic flare causing a mixed picture of her respiratory failure. She has received 5 days PLEX with no significant impovement and has failed one extubation trial   Recommendations: 1)Continue IVIG 2/5 days 2) will decrease prednisone from 20 mg to 10 mg as steroids canacutely worsen MG 3) please continue Mestinon unless patient develops diarrhea  Continue to watch, if she does not improve we will start IVIG tomorrow.    Karena Addison Devra Stare MD [facility] [phone]  If 7pm to 7am, please call on call as listed on AMION.

## 2016-11-09 NOTE — Progress Notes (Signed)
PULMONARY / CRITICAL CARE MEDICINE   Name: [name] MRN: [id number] DOB: 1930-06-26    ADMISSION DATE:  10/29/2016 CONSULTATION DATE:  10/29/16  REFERRING MD:  Dr. Laverta Baltimore / EDP   CHIEF COMPLAINT:  SOB  HISTORY OF PRESENT ILLNESS:   83 y/o F who presented to [facility] on 8/7 via EMS from a SNF with reports of increasing shortness of breath and confusion.  Hx Myasthenia Gravis (baseline prednisone 5mg , followed by Dr. Jaynee Eagles) and dementia > lives in a memory care unit.   She was combative and hypoxic with EMS who apparently attempted nasal intubation which was unsuccessful. Saturations were in the 70's on NRB.  She also vomited in route with concern for possible aspiration.  BVM ventilation provided until arrival to ER. On arrival to ER, she unresponsive and was intubated for airway protection.   STUdies  Echo 8/10 nml EF, RVSP 48  CULTURES:  BCx2 8/7 >> ng UC 8/7 >> ng  ANTIBIOTICS: Vanco 8/7 >> 8/10 Zosyn 8/7 >> 8/13  SIGNIFICANT EVENTS: 8/07  Admit with SOB, intubated   LINES/TUBES: ETT 8/7 >> 8/14; 8/14 >>  Rt IJ HD cath 8/8 >>  SUBJECTIVE: Alert, calm . F/c , mae x 4 , gen weakness. ON precedex.  Weaning this am. Good vol.  Day 2/5 IVIG   VITAL SIGNS: BP (!) 91/43   Pulse 66   Temp 99.7 F (37.6 C) (Axillary)   Resp 18   Ht 5\' 1"  (1.549 m)   Wt 138 lb 10.7 oz (62.9 kg)   SpO2 94%   BMI 26.20 kg/m   HEMODYNAMICS:    VENTILATOR SETTINGS: Vent Mode: CPAP;PSV FiO2 (%):  [40 %] 40 % Set Rate:  [12 bmp] 12 bmp Vt Set:  [500 mL] 500 mL PEEP:  [5 cmH20] 5 cmH20 Pressure Support:  [10 cmH20] 10 cmH20 Plateau Pressure:  [14 cmH20-19 cmH20] 15 cmH20  INTAKE / OUTPUT: I/O last 3 completed shifts: In: 5853.6 [I.V.:3303.6; NG/GT:2550] Out: 3655 [Urine:3655]  PHYSICAL EXAMINATION:. Gen: Elderly ill appearing woman, ventilated HEENT: ET tube in place, pupils equal Lungs: Decreased at both bases, no wheezing CV:  Regular, no murmur Abd: Soft,  nontender, positive bowel sounds Ext: No lower extremity edema Skin: Skin tears and significant bruising, dressings on bilateral upper extremities Neuro: Not to questions, follows commands, oral secretions present, strong cough, moves bilateral upper extremities/lower ext   LABS:  BMET  Recent Labs Lab 11/06/16 0407 11/06/16 1353 11/07/16 0410 11/08/16 0403  NA 146* 145 145 141  K 3.6 3.8 2.8* 3.7  CL 114* 108 120* 114*  CO2 25  --  21* 23  BUN 14 19 15 11   CREATININE 0.60 0.50 0.46 0.44  GLUCOSE 95 146* 124* 122*    Electrolytes  Recent Labs Lab 11/03/16 0439 11/04/16 0500 11/05/16 0324 11/06/16 0407 11/07/16 0410 11/08/16 0403  CALCIUM 8.7* 8.8* 8.1* 8.5* 7.4* 8.3*  MG 2.0 1.9 1.6* 2.1  --  2.2  PHOS 2.4* 3.4 3.1  --   --   --     CBC  Recent Labs Lab 11/03/16 0439 11/04/16 0500 11/06/16 0407 11/06/16 1353  WBC 7.5 7.0 8.5  --   HGB 9.8* 9.4* 9.8* 9.9*  HCT 29.4* 27.9* 30.5* 29.0*  PLT 155 160 242  --     Coag's  Recent Labs Lab 11/04/16 0500  INR 1.10    Sepsis Markers No results for input(s): LATICACIDVEN, PROCALCITON, O2SATVEN in the last 168 hours.  ABG  Recent Labs Lab 11/05/16 1824  PHART 7.467*  PCO2ART 38.6  PO2ART 118.0*    Liver Enzymes No results for input(s): AST, ALT, ALKPHOS, BILITOT, ALBUMIN in the last 168 hours.  Cardiac Enzymes No results for input(s): TROPONINI, PROBNP in the last 168 hours.  Glucose  Recent Labs Lab 11/08/16 1152 11/08/16 1519 11/08/16 1951 11/08/16 2355 11/09/16 0426 11/09/16 0751  GLUCAP 122* 107* 118* 110* 117* 110*   Imaging No results found.reviewed  STUDIES:    DISCUSSION: 83 y/o F with reported MG on prednisone and advanced dementia (per report, lives in a memory care unit) who was admitted 8/7 with increased SOB.  EMS attempted intubation nasally > pt vomited, concern for aspiration.  Intubated in ER.  Started plasmapheresis 8/9 for myasthenia flare  ASSESSMENT /  PLAN:  PULMONARY A: Acute Hypoxic Respiratory Failure, multifactorial due to my sending a flare plus possible aspiration event Aspiration pneumonia P:   Continue to follow NIF and vital capacity Pressure support as she can tolerate. Her principal reason for failing 8/14 appeared to be secretion management, airway protection Based on MD discussions with the patient's self-care power of attorney I suspect that she is not a tracheostomy or long-term ventilation candidate Minimize sedating medications, note Precedex started 8/16 Manage myasthenia gravis as below  CARDIOVASCULAR A:  Demand ischemia, peak Tp 0.06 Cardiogenic pulm edema P:  Dosing diuretics daily, goal I=O. Single dose lasix 8/18  Continue aspirin  RENAL A: Mild Elevation of Lactic Acid. Resolved Hypokalemia /hypophos ? refeeding hypernatremia P:   Continue free water as ordered, follow-up sodium Follow BMP, urine output Replace lytes as indicated  GASTROINTESTINAL A:   Nausea / Vomiting - suspect related to nasal intubation attempt, resolved P:   Tube feedings as ordered Zofran if needed Pantoprazole for prophylaxis   HEMATOLOGIC A:   Mild Leukocytosis, resolved P:  Heparin DVT prophylaxis Follow CBC  INFECTIOUS A:   SIRS / Sepsis Aspiration  pneumonia P:   Zosyn 7 days completed on 8/13  ENDOCRINE A:   Hypothyroidism  Hyperglycemia    P:   Sliding scale insulin and CBG per protocol Continue Synthroid  NEUROLOGIC A:   Myasthenia Gravis - on baseline prednisone 5mg  QD, followed by Dr. Jaynee Eagles  Plasmapheresis for MG flare P:   RASS goal: 0 to -1  Case discussed with neurology 8/17. Prednisone down to 10 mg daily. Continue Mestinon today. Planning to start IVIG 8/17 x 5 d  Completed 5 days plasmapheresis 8/14 Serial neurological exam, NIF, vital capacity Favor weaning Precedex, goal to off  FAMILY  - Updates: Reviewed patient's status with her healthcare power of attorney on 8/14. At  this point all aggressive care to hopefully reverse acute process is appropriate. She did note that the patient would not want indefinite invasive support. We will have to revisit goals if we do not see improvement over the next several days.  - Inter-disciplinary family meet or Palliative Care meeting due by: 8/16  Tammy Parrett NP-C  [facility]  [phone]    STAFF NOTE: I, Merrie Roof, MD FACP have personally reviewed patient's available data, including medical history, events of note, physical examination and test results as part of my evaluation. I have discussed with resident/NP and other care providers such as pharmacist, RN and RRT. In addition, I personally evaluated patient and elicited key findings FB:PZWCH, alert, rass 0, fc, min-modptosis,  ronchi crackles on chest exam, abdo soft, edema present, pcxr which I  reviewed shows ATX rt greater left, hint int edema, should repeat pcxr for fluid status and ett placement in am , she was pos again 2.3 liters , she is weaning and requiring PS 10, would favor neg balance on lasix  And assess improvement sin resp status, NIF vc important daily with IVIG, with such refractory MG would wait till most of treatments are received IVIG, consider start TF and assess tolerability as trickle, some abdo pain assess kub, secretion status still an issue on mestinon? pred per neuro The patient is critically ill with multiple organ systems failure and requires high complexity decision making for assessment and support, frequent evaluation and titration of therapies, application of advanced monitoring technologies and extensive interpretation of multiple databases.   Critical Care Time devoted to patient care services described in this note is 30 Minutes. This time reflects time of care of this signee: Merrie Roof, MD FACP. This critical care time does not reflect procedure time, or teaching time or supervisory time of PA/NP/Med  student/Med Resident etc but could involve care discussion time. Rest per NP/medical resident whose note is outlined above and that I agree with   Lavon Paganini. Titus Mould, MD, [place] Pgr: [facility] 11/09/2016 11:59 AM

## 2016-11-10 DIAGNOSIS — Z978 Presence of other specified devices: Secondary | ICD-10-CM

## 2016-11-10 DIAGNOSIS — J9600 Acute respiratory failure, unspecified whether with hypoxia or hypercapnia: Secondary | ICD-10-CM

## 2016-11-10 LAB — BASIC METABOLIC PANEL
Anion gap: 6 (ref 5–15)
BUN: 15 mg/dL (ref 6–20)
CALCIUM: 8.6 mg/dL — AB (ref 8.9–10.3)
CO2: 27 mmol/L (ref 22–32)
CREATININE: 0.51 mg/dL (ref 0.44–1.00)
Chloride: 106 mmol/L (ref 101–111)
GFR calc Af Amer: >60 mL/min (ref >60)
GLUCOSE: 135 mg/dL — AB (ref 65–99)
Potassium: 3.3 mmol/L — ABNORMAL LOW (ref 3.5–5.1)
Sodium: 139 mmol/L (ref 135–145)

## 2016-11-10 LAB — CBC
HEMATOCRIT: 27.5 % — AB (ref 36.0–46.0)
Hemoglobin: 8.7 g/dL — ABNORMAL LOW (ref 12.0–15.0)
MCH: 30.5 pg (ref 26.0–34.0)
MCHC: 31.6 g/dL (ref 30.0–36.0)
MCV: 96.5 fL (ref 78.0–100.0)
PLATELETS: 364 10*3/uL (ref 150–400)
RBC: 2.85 MIL/uL — ABNORMAL LOW (ref 3.87–5.11)
RDW: 15.6 % — AB (ref 11.5–15.5)
WBC: 8.5 10*3/uL (ref 4.0–10.5)

## 2016-11-10 LAB — GLUCOSE, CAPILLARY
GLUCOSE-CAPILLARY: 132 mg/dL — AB (ref 65–99)
GLUCOSE-CAPILLARY: 134 mg/dL — AB (ref 65–99)
GLUCOSE-CAPILLARY: 96 mg/dL (ref 65–99)
Glucose-Capillary: 113 mg/dL — ABNORMAL HIGH (ref 65–99)
Glucose-Capillary: 101 mg/dL — ABNORMAL HIGH (ref 65–99)
Glucose-Capillary: 111 mg/dL — ABNORMAL HIGH (ref 65–99)

## 2016-11-10 LAB — PHOSPHORUS: Phosphorus: 3.7 mg/dL (ref 2.5–4.6)

## 2016-11-10 LAB — MAGNESIUM: Magnesium: 1.9 mg/dL (ref 1.7–2.4)

## 2016-11-10 MED ORDER — POTASSIUM CHLORIDE CRYS ER 20 MEQ PO TBCR
40.0000 meq | EXTENDED_RELEASE_TABLET | Freq: Once | ORAL | Status: AC
  Administered 2016-11-10: 40 meq via ORAL
  Filled 2016-11-10: qty 2

## 2016-11-10 MED ORDER — ALBUTEROL SULFATE (2.5 MG/3ML) 0.083% IN NEBU: 2.5000 mg | INHALATION_SOLUTION | Freq: Four times a day (QID) | RESPIRATORY_TRACT | Status: DC

## 2016-11-10 MED ORDER — ALBUTEROL SULFATE (2.5 MG/3ML) 0.083% IN NEBU: 2.5000 mg | INHALATION_SOLUTION | Freq: Four times a day (QID) | RESPIRATORY_TRACT | Status: DC | PRN

## 2016-11-10 MED ORDER — PANCRELIPASE (LIP-PROT-AMYL) 12000-38000 UNITS PO CPEP
24000.0000 [IU] | ORAL_CAPSULE | Freq: Once | ORAL | Status: DC
  Filled 2016-11-10: qty 2

## 2016-11-10 MED ORDER — SODIUM BICARBONATE 650 MG PO TABS
650.0000 mg | ORAL_TABLET | Freq: Once | ORAL | Status: AC
  Administered 2016-11-10: 650 mg via ORAL
  Filled 2016-11-10 (×2): qty 1

## 2016-11-10 MED ORDER — FUROSEMIDE 10 MG/ML IJ SOLN
40.0000 mg | Freq: Four times a day (QID) | INTRAMUSCULAR | Status: DC
  Administered 2016-11-10 – 2016-11-13 (×12): 40 mg via INTRAVENOUS
  Filled 2016-11-10 (×12): qty 4

## 2016-11-10 NOTE — Progress Notes (Signed)
PULMONARY / CRITICAL CARE MEDICINE   Name: [name] MRN: [id number] DOB: [birth year]    ADMISSION DATE:  10/29/2016 CONSULTATION DATE:  10/29/16  REFERRING MD:  Dr. Laverta Baltimore / EDP   CHIEF COMPLAINT:  SOB  HISTORY OF PRESENT ILLNESS:   83 y/o F who presented to [facility] on 8/7 via EMS from a SNF with reports of increasing shortness of breath and confusion.  Hx Myasthenia Gravis (baseline prednisone 5mg , followed by Dr. Jaynee Eagles) and dementia > lives in a memory care unit.   She was combative and hypoxic with EMS who apparently attempted nasal intubation which was unsuccessful. Saturations were in the 70's on NRB.  She also vomited in route with concern for possible aspiration.  BVM ventilation provided until arrival to ER. On arrival to ER, she unresponsive and was intubated for airway protection.   STUdies Echo 8/10 nml EF, RVSP 48  CULTURES:  BCx2 8/7 >> ng UC 8/7 >> ng  ANTIBIOTICS: Vanco 8/7 >> 8/10 Zosyn 8/7 >> 8/13  SIGNIFICANT EVENTS: 8/07  Admit with SOB, intubated   LINES/TUBES: ETT 8/7 >> 8/14; 8/14 >>  Rt IJ HD cath 8/8 >>  SUBJECTIVE: Weaning this am , good vol . NIF 26, VC 485. Up in chair  Off precedex this am 8/19 .Calm and alert.  Day 3/5 IVIG   VITAL SIGNS: BP (!) 100/43   Pulse 73   Temp 98.7 F (37.1 C) (Axillary)   Resp (!) 21   Ht 5\' 1"  (1.549 m)   Wt 138 lb 14.2 oz (63 kg)   SpO2 99%   BMI 26.24 kg/m   HEMODYNAMICS:    VENTILATOR SETTINGS: Vent Mode: CPAP;PSV FiO2 (%):  [40 %] 40 % Set Rate:  [12 bmp] 12 bmp Vt Set:  [500 mL] 500 mL PEEP:  [5 cmH20] 5 cmH20 Pressure Support:  [10 cmH20] 10 cmH20 Plateau Pressure:  [13 cmH20-19 cmH20] 13 cmH20  INTAKE / OUTPUT: I/O last 3 completed shifts: In: 6312 [I.V.:3562; NG/GT:2750] Out: 2460 [Urine:2460]  PHYSICAL EXAMINATION:. Gen: Elderly ill appearing woman, ventilated. Sitting in charir  HEENT: ET tube in place, pupils equal Lungs: Decreased at both bases, no wheezing CV:   Regular, no murmur Abd: Soft, nontender, positive bowel sounds Ext: No lower extremity edema Skin: Skin tears and significant bruising, dressings on bilateral upper extremities Neuro: Nods to questions, follows commands, oral secretions present, strong cough, moves bilateral upper extremities/lower ext , good foot presses and hand squeezes   LABS:  BMET  Recent Labs Lab 11/07/16 0410 11/08/16 0403 11/10/16 0412  NA 145 141 139  K 2.8* 3.7 3.3*  CL 120* 114* 106  CO2 21* 23 27  BUN 15 11 15   CREATININE 0.46 0.44 0.51  GLUCOSE 124* 122* 135*    Electrolytes  Recent Labs Lab 11/04/16 0500 11/05/16 0324 11/06/16 0407 11/07/16 0410 11/08/16 0403 11/10/16 0412  CALCIUM 8.8* 8.1* 8.5* 7.4* 8.3* 8.6*  MG 1.9 1.6* 2.1  --  2.2 1.9  PHOS 3.4 3.1  --   --   --  3.7    CBC  Recent Labs Lab 11/04/16 0500 11/06/16 0407 11/06/16 1353 11/10/16 0412  WBC 7.0 8.5  --  8.5  HGB 9.4* 9.8* 9.9* 8.7*  HCT 27.9* 30.5* 29.0* 27.5*  PLT 160 242  --  364    Coag's  Recent Labs Lab 11/04/16 0500  INR 1.10    Sepsis Markers No results for input(s): LATICACIDVEN, PROCALCITON, O2SATVEN in the  last 168 hours.  ABG  Recent Labs Lab 11/05/16 1824  PHART 7.467*  PCO2ART 38.6  PO2ART 118.0*    Liver Enzymes No results for input(s): AST, ALT, ALKPHOS, BILITOT, ALBUMIN in the last 168 hours.  Cardiac Enzymes No results for input(s): TROPONINI, PROBNP in the last 168 hours.  Glucose  Recent Labs Lab 11/09/16 1149 11/09/16 1618 11/09/16 2011 11/09/16 2358 11/10/16 0420 11/10/16 0759  GLUCAP 123* 121* 98 113* 134* 113*   Imaging No results found.reviewed  STUDIES:    DISCUSSION: 83 y/o F with reported MG on prednisone and advanced dementia (per report, lives in a memory care unit) who was admitted 8/7 with increased SOB.  EMS attempted intubation nasally > pt vomited, concern for aspiration.  Intubated in ER.  Started plasmapheresis 8/9 for myasthenia  flare  ASSESSMENT / PLAN:  PULMONARY A: Acute Hypoxic Respiratory Failure, multifactorial due to my sending a flare plus possible aspiration event Aspiration pneumonia P:   Continue to follow NIF and vital capacity Pressure support as she can tolerate. Her principal reason for failing 8/14 appeared to be secretion management, airway protection Based on MD discussions with the patient's self-care power of attorney I suspect that she is not a tracheostomy or long-term ventilation candidate  Manage myasthenia gravis as below Check cxr in am   CARDIOVASCULAR A:  Demand ischemia, peak Tp 0.06 Cardiogenic pulm edema P:  Dosing diuretics daily, goal I=O.  Cont lasix  Continue aspirin  RENAL A: Mild Elevation of Lactic Acid. Resolved Hypokalemia  /hypophos resolved  hypernatremia P:   Continue free water as ordered, follow-up sodium Follow BMP, urine output Replace lytes as indicated Replace K +   GASTROINTESTINAL A:   Nausea / Vomiting - suspect related to nasal intubation attempt, resolved P:   Tube feedings as ordered Zofran if needed Pantoprazole for prophylaxis   HEMATOLOGIC A:   Mild Leukocytosis, resolved P:  Heparin DVT prophylaxis Follow CBC  INFECTIOUS A:   SIRS / Sepsis Aspiration  pneumonia P:   Zosyn 7 days completed on 8/13  ENDOCRINE A:   Hypothyroidism  Hyperglycemia    P:   Sliding scale insulin and CBG per protocol Continue Synthroid  NEUROLOGIC A:   Myasthenia Gravis - on baseline prednisone 5mg  QD, followed by Dr. Jaynee Eagles  Plasmapheresis for MG flare P:   RASS goal: 0 to -1  Case discussed with neurology 8/17. Prednisone down to 10 mg daily. Continue Mestinon today. Planning to start IVIG 8/17 x 5 d  Completed 5 days plasmapheresis 8/14 Serial neurological exam, NIF, vital capacity Off precedex 8/19 .   FAMILY  - Updates: Reviewed patient's status with her healthcare power of attorney on 8/14. At this point all aggressive care  to hopefully reverse acute process is appropriate. She did note that the patient would not want indefinite invasive support. We will have to revisit goals if we do not see improvement over the next several days.  - Inter-disciplinary family meet or Palliative Care meeting due by: 8/16  Tammy Parrett NP-C  [facility]  [phone]   11/10/2016    STAFF NOTE: IMerrie Roof, MD FACP have personally reviewed patient's available data, including medical history, events of note, physical examination and test results as part of my evaluation. I have discussed with resident/NP and other care providers such as pharmacist, RN and RRT. In addition, I personally evaluated patient and elicited key findings of: awake, FC, rass -1, ronchi remains, coarse bs at  times, jvd noted, no new pcxr, I have reviewed prior, slight pos balance despite lasix, renal fxn could tolerate further diuresis with k supp, to chair as able, mobilize, ptosis remains, IVOG for full treatment, NIF is barely reasonable, VC is poor, no extubation planned, follow trend NIF VC further with treatments, follow up pcxr required, she does not need BDers, change to prn, mobilize, weaning this am PS 10 seems to be needed for volumes as of now, goal 5 cc/kg The patient is critically ill with multiple organ systems failure and requires high complexity decision making for assessment and support, frequent evaluation and titration of therapies, application of advanced monitoring technologies and extensive interpretation of multiple databases.   Critical Care Time devoted to patient care services described in this note is 30 Minutes. This time reflects time of care of this signee: Merrie Roof, MD FACP. This critical care time does not reflect procedure time, or teaching time or supervisory time of PA/NP/Med student/Med Resident etc but could involve care discussion time. Rest per NP/medical resident whose note is outlined  above and that I agree with   Lavon Paganini. Titus Mould, MD, [place] Pgr: [facility] 11/10/2016 1:54 PM

## 2016-11-10 NOTE — Progress Notes (Signed)
NIF - 26 VC 485

## 2016-11-10 NOTE — Progress Notes (Signed)
Subjective: She continues to be intubated. Sitting in chair  ROS: negative except above  Examination  Vitals:   11/10/16 1600 11/10/16 1651  BP: (!) 128/59 (!) 128/59  Pulse: 88   Resp: (!) 33 (!) 26  Temp:    SpO2: 95% 96%    EXAM:  Gen: comfortable, intubated RR: intubated, no respiratory distress  Neuro: MS: alert, follows commands but cooperative today CN:no facial assymetry, pupils equal and reactive Motor: UE both 4/5 and LE bilaterally 4/5 Sensation: Intact to light touch.  DTRs:2+, symmetric Toes downgoing bilaterally.   Basic Metabolic Panel:  Last Labs    Recent Labs Lab 11/03/16 0439 11/04/16 0500 11/05/16 0324 11/06/16 0407 11/06/16 1353 11/07/16 0410 11/08/16 0403  NA 150* 147* 143 146* 145 145 141  K 3.1* 3.2* 3.5 3.6 3.8 2.8* 3.7  CL 126* 118* 114* 114* 108 120* 114*  CO2 19* 23 25 25   --  21* 23  GLUCOSE 109* 108* 111* 95 146* 124* 122*  BUN 12 16 20 14 19 15 11   CREATININE 0.54 0.48 0.54 0.60 0.50 0.46 0.44  CALCIUM 8.7* 8.8* 8.1* 8.5*  --  7.4* 8.3*  MG 2.0 1.9 1.6* 2.1  --   --  2.2  PHOS 2.4* 3.4 3.1  --   --   --   --       CBC:  Last Labs    Recent Labs Lab 11/03/16 0439 11/04/16 0500 11/06/16 0407 11/06/16 1353  WBC 7.5 7.0 8.5  --   HGB 9.8* 9.4* 9.8* 9.9*  HCT 29.4* 27.9* 30.5* 29.0*  MCV 93.3 93.6 95.9  --   PLT 155 160 242  --         NIF -26 as well   ASSESSMENT AND PLAN  Myasthenia Crises  83 year old female with myasthenia gravis is presenting with respiratory failure. Initial presentation was unclear given the character of her respiratory failure. She hadprogressive dysphagiashortly before her abrupt worsening, and I suspect that she may have had an aspiration event secondary to myasthenic flare causing a mixed picture of her respiratory failure. She has received 5 days PLEX with no significant impovement and has failed one extubation trial   Recommendations: 1)Continue IVIG x 5  days ( 3/5) days 2) Home prednisone was 5mg  , initially increased, decreased prednisone from 20 mg to 10 mg as increase steroids can acutely worsen Myasthenia, however change in steroid dose has not helped  3) please continue Mestinon unless patient develops diarrhea  Continue to watch, if she does not improve we will start IVIG tomorrow.   Karena Addison Shaquon Gropp MD [facility] [phone]  If 7pm to 7am, please call on call as listed on AMION.

## 2016-11-11 ENCOUNTER — Inpatient Hospital Stay (HOSPITAL_COMMUNITY): Payer: Medicare Other

## 2016-11-11 LAB — GLUCOSE, CAPILLARY
GLUCOSE-CAPILLARY: 111 mg/dL — AB (ref 65–99)
GLUCOSE-CAPILLARY: 147 mg/dL — AB (ref 65–99)
GLUCOSE-CAPILLARY: 129 mg/dL — AB (ref 65–99)
Glucose-Capillary: 114 mg/dL — ABNORMAL HIGH (ref 65–99)
Glucose-Capillary: 125 mg/dL — ABNORMAL HIGH (ref 65–99)
Glucose-Capillary: 144 mg/dL — ABNORMAL HIGH (ref 65–99)

## 2016-11-11 LAB — BASIC METABOLIC PANEL
ANION GAP: 9 (ref 5–15)
Anion gap: 10 (ref 5–15)
BUN: 19 mg/dL (ref 6–20)
BUN: 29 mg/dL — ABNORMAL HIGH (ref 6–20)
CALCIUM: 9.2 mg/dL (ref 8.9–10.3)
CHLORIDE: 103 mmol/L (ref 101–111)
CO2: 28 mmol/L (ref 22–32)
CO2: 27 mmol/L (ref 22–32)
CREATININE: 0.74 mg/dL (ref 0.44–1.00)
CREATININE: 0.74 mg/dL (ref 0.44–1.00)
Calcium: 9.1 mg/dL (ref 8.9–10.3)
Chloride: 104 mmol/L (ref 101–111)
GFR calc Af Amer: >60 mL/min (ref >60)
GFR calc non Af Amer: >60 mL/min (ref >60)
GLUCOSE: 112 mg/dL — AB (ref 65–99)
GLUCOSE: 134 mg/dL — AB (ref 65–99)
Potassium: 2.5 mmol/L — CL (ref 3.5–5.1)
Potassium: 4.4 mmol/L (ref 3.5–5.1)
Sodium: 141 mmol/L (ref 135–145)
Sodium: 140 mmol/L (ref 135–145)

## 2016-11-11 LAB — PHOSPHORUS: Phosphorus: 2.9 mg/dL (ref 2.5–4.6)

## 2016-11-11 LAB — MAGNESIUM: MAGNESIUM: 2.1 mg/dL (ref 1.7–2.4)

## 2016-11-11 MED ORDER — CHLORHEXIDINE GLUCONATE 0.12% ORAL RINSE (MEDLINE KIT)
15.0000 mL | Freq: Two times a day (BID) | OROMUCOSAL | Status: DC
  Administered 2016-11-11 – 2016-11-13 (×4): 15 mL via OROMUCOSAL

## 2016-11-11 MED ORDER — POTASSIUM CHLORIDE 20 MEQ/15ML (10%) PO SOLN
40.0000 meq | ORAL | Status: AC
  Administered 2016-11-11 (×3): 40 meq
  Filled 2016-11-11 (×3): qty 30

## 2016-11-11 MED ORDER — MIDAZOLAM HCL 2 MG/2ML IJ SOLN
0.5000 mg | Freq: Once | INTRAMUSCULAR | Status: AC
  Administered 2016-11-11: 0.5 mg via INTRAVENOUS
  Filled 2016-11-11: qty 2

## 2016-11-11 MED ORDER — ORAL CARE MOUTH RINSE
15.0000 mL | OROMUCOSAL | Status: DC
  Administered 2016-11-11 – 2016-11-12 (×8): 15 mL via OROMUCOSAL

## 2016-11-11 MED ORDER — PREDNISONE 5 MG PO TABS
5.0000 mg | ORAL_TABLET | Freq: Every day | ORAL | Status: DC
  Administered 2016-11-12: 5 mg
  Filled 2016-11-11: qty 1

## 2016-11-11 NOTE — Progress Notes (Signed)
[facility] Progress Note Patient Name: BERTINE SCHLOTTMAN DOB: [birth year] MRN: [id number]   Date of Service  11/11/2016  HPI/Events of Note  Pure wick not handling output Add foley  eICU Interventions       Intervention Category Minor Interventions: Routine modifications to care plan (e.g. PRN medications for pain, fever)  Raylene Miyamoto. 11/11/2016, 7:46 PM

## 2016-11-11 NOTE — Progress Notes (Signed)
NIF: -20, VC: 515 with decent pt. Effort.

## 2016-11-11 NOTE — Evaluation (Signed)
Occupational Therapy Evaluation Patient Details Name: [name] MRN: [id number] DOB: December 05, 1930 Today's Date: 11/11/2016    History of Present Illness Pt is an 83 y/o female with a significant PMH of myathenia gravis and dementia. She presented from SNF (memory care unit) with increasing SOB and confusion. She was intubated in the ED. Pt was admitted for myasthenia flare and aspiration PNA.    Clinical Impression   Pt adamantly declining mobility to sit at EOB this session and thus evaluation limited to bed level. She was agreeable for repositioning and assessment at bed level. She demonstrates generalized weakness in B UE and decreased activity tolerance for ADL participation. At baseline, per chart, pt was living in memory care unit and unsure of level of ADL participation. She demonstrates ability to follow commands and appropriately respond. Will continue to assess cognition as pt currently intubated. Recommend SNF level rehabilitation post-acute D/C. Will continue to follow while admitted.     Follow Up Recommendations  SNF;Supervision/Assistance - 24 hour    Equipment Recommendations  Other (comment) (TBD at next venue of care)    Recommendations for Other Services       Precautions / Restrictions Precautions Precautions: Fall Precaution Comments: Intubated Restrictions Weight Bearing Restrictions: No      Mobility Bed Mobility Overal bed mobility: Needs Assistance             General bed mobility comments: Pt pulling sheets up over her torso and grasping tightly. Pt agreeable to repositioning in bed and assisted in pushing herself toward [facility] by using legs to assist in positioning.   Transfers                 General transfer comment: Unable    Balance       Sitting balance - Comments: Unable to be assessed this session                                   ADL either performed or assessed with clinical judgement   ADL Overall ADL's :  Needs assistance/impaired                                       General ADL Comments: Pt requiring total assistance at this time. Pt waving therapist away when attempting to initiate mobility. Pointing to clock and mouthing "later."     Vision   Additional Comments: Will continue to assess.      Perception     Praxis      Pertinent Vitals/Pain Pain Assessment: Faces Faces Pain Scale: Hurts little more Pain Location: Indicated mouth, nose, throat, head Pain Descriptors / Indicators: Grimacing;Discomfort Pain Intervention(s): Monitored during session;Repositioned     Hand Dominance     Extremity/Trunk Assessment Upper Extremity Assessment Upper Extremity Assessment: Generalized weakness (moving against gravity with full AROM)   Lower Extremity Assessment Lower Extremity Assessment: Generalized weakness   Cervical / Trunk Assessment Cervical / Trunk Assessment: Kyphotic   Communication Communication Communication: Other (comment) (intubated)   Cognition Arousal/Alertness: Awake/alert Behavior During Therapy: Anxious Overall Cognitive Status: History of cognitive impairments - at baseline                                 General Comments: Memory care unit at baseline.  Able to follow commands and mouth responses to questions over vent. Pt able to indicate where she has pain.    General Comments       Exercises     Shoulder Instructions      Home Living Family/patient expects to be discharged to:: Skilled nursing facility (Some notes say ALF and some say SNF memory care)                                        Prior Functioning/Environment Level of Independence: Needs assistance        Comments: Pt unable to convey, however it is likely that she required assist for ADL's and possibly mobility PTA.         OT Problem List: Decreased strength;Decreased activity tolerance;Impaired balance (sitting and/or  standing);Decreased safety awareness;Decreased knowledge of use of DME or AE;Decreased knowledge of precautions;Pain      OT Treatment/Interventions: Self-care/ADL training;Therapeutic exercise;Energy conservation;DME and/or AE instruction;Therapeutic activities;Patient/family education;Balance training    OT Goals(Current goals can be found in the care plan section) Acute Rehab OT Goals Patient Stated Goal: none stated OT Goal Formulation: With patient Time For Goal Achievement: 11/25/16 Potential to Achieve Goals: Good ADL Goals Pt Will Perform Grooming: with set-up;bed level Pt Will Transfer to Toilet: with max assist;stand pivot transfer;bedside commode Additional ADL Goal #1: Pt will complete bed mobility in preparation for ADL participation seated at EOB with overall max assist.  Additional ADL Goal #2: Pt will tolerate sitting at EOB for grooming tasks with mod assist for balance and stable vital signs.   OT Frequency: Min 2X/week   Barriers to D/C:            Co-evaluation PT/OT/SLP Co-Evaluation/Treatment: Yes Reason for Co-Treatment: For patient/therapist safety;To address functional/ADL transfers;Complexity of the patient's impairments (multi-system involvement) PT goals addressed during session: Mobility/safety with mobility        AM-PAC PT "6 Clicks" Daily Activity     Outcome Measure Help from another person eating meals?: Total Help from another person taking care of personal grooming?: Total Help from another person toileting, which includes using toliet, bedpan, or urinal?: Total Help from another person bathing (including washing, rinsing, drying)?: Total Help from another person to put on and taking off regular upper body clothing?: Total Help from another person to put on and taking off regular lower body clothing?: Total 6 Click Score: 6   End of Session Equipment Utilized During Treatment:  (ET tube) Nurse Communication: Mobility status  Activity  Tolerance: Patient tolerated treatment well Patient left: in bed;with call bell/phone within reach  OT Visit Diagnosis: Unsteadiness on feet (R26.81);Muscle weakness (generalized) (M62.81)                Time: 6168-3729 OT Time Calculation (min): 16 min Charges:  OT General Charges $OT Visit: 1 Procedure OT Evaluation $OT Eval Moderate Complexity: 1 Procedure G-Codes:     Meredith Herrlich, MS OTR/L  Pager: [place] A Darrol Brandenburg 11/11/2016, 1:50 PM

## 2016-11-11 NOTE — Progress Notes (Signed)
PULMONARY / CRITICAL CARE MEDICINE   Name: DELISA FINCK MRN: [id number] DOB: 1931/02/02    ADMISSION DATE:  10/29/2016 CONSULTATION DATE:  10/29/16  REFERRING MD:  Dr. Laverta Baltimore / EDP   CHIEF COMPLAINT:  SOB  HISTORY OF PRESENT ILLNESS:   83 y/o F who presented to [facility] on 8/7 via EMS from a SNF with reports of increasing shortness of breath and confusion.  Hx Myasthenia Gravis (baseline prednisone 5mg , followed by Dr. Jaynee Eagles) and dementia > lives in a memory care unit.   She was combative and hypoxic with EMS who apparently attempted nasal intubation which was unsuccessful. Saturations were in the 70's on NRB.  She also vomited in route with concern for possible aspiration.  BVM ventilation provided until arrival to ER. On arrival to ER, she unresponsive and was intubated for airway protection.   STUdies Echo 8/10 nml EF, RVSP 48  CULTURES:  BCx2 8/7 >> ng UC 8/7 >> ng  ANTIBIOTICS: Vanco 8/7 >> 8/10 Zosyn 8/7 >> 8/13  SIGNIFICANT EVENTS: 8/07  Admit with SOB, intubated   LINES/TUBES: ETT 8/7 >> 8/14; 8/14 >>  Rt IJ HD cath 8/8 >> 8/17 RUE PICC 8/17 >>   SUBJECTIVE: Day 4 of 5 IVIG NIF -20, VC 515cc Complaints of double vision   VITAL SIGNS: BP (!) 120/55   Pulse 86   Temp 98.7 F (37.1 C) (Oral)   Resp (!) 22   Ht 5\' 1"  (1.549 m)   Wt 60.1 kg (132 lb 7.9 oz)   SpO2 93%   BMI 25.03 kg/m   HEMODYNAMICS:    VENTILATOR SETTINGS: Vent Mode: PRVC FiO2 (%):  [40 %] 40 % Set Rate:  [12 bmp] 12 bmp Vt Set:  [500 mL] 500 mL PEEP:  [5 cmH20] 5 cmH20 Pressure Support:  [10 cmH20] 10 cmH20 Plateau Pressure:  [14 cmH20-22 cmH20] 22 cmH20  INTAKE / OUTPUT: I/O last 3 completed shifts: In: 2744.3 [I.V.:1054.3; NG/GT:1690] Out: 65 [Urine:3280]  PHYSICAL EXAMINATION:. Gen: Elderly ill-appearing woman HEENT: ET tube in place, pupils equal Lungs: Decreased at both bases, no crackles, no wheezing CV: Regular, no murmur Abd: Soft, nontender, positive  bowel sounds Ext: No lower extremity edema, right upper extremity PICC line Skin: Skin tears and significant bruising, dressings on bilateral upper extremities Neuro: Awake, nods to questions, confirms double vision, fatigable weakness noted, low NIF as documented above, moderate cough strength  LABS:  BMET  Recent Labs Lab 11/08/16 0403 11/10/16 0412 11/11/16 0411  NA 141 139 141  K 3.7 3.3* 2.5*  CL 114* 106 103  CO2 23 27 28   BUN 11 15 19   CREATININE 0.44 0.51 0.74  GLUCOSE 122* 135* 112*    Electrolytes  Recent Labs Lab 11/05/16 0324  11/08/16 0403 11/10/16 0412 11/11/16 0411  CALCIUM 8.1*  < > 8.3* 8.6* 9.1  MG 1.6*  < > 2.2 1.9 2.1  PHOS 3.1  --   --  3.7 2.9  < > = values in this interval not displayed.  CBC  Recent Labs Lab 11/06/16 0407 11/06/16 1353 11/10/16 0412  WBC 8.5  --  8.5  HGB 9.8* 9.9* 8.7*  HCT 30.5* 29.0* 27.5*  PLT 242  --  364    Coag's No results for input(s): APTT, INR in the last 168 hours.  Sepsis Markers No results for input(s): LATICACIDVEN, PROCALCITON, O2SATVEN in the last 168 hours.  ABG  Recent Labs Lab 11/05/16 1824  PHART 7.467*  PCO2ART 38.6  PO2ART 118.0*    Liver Enzymes No results for input(s): AST, ALT, ALKPHOS, BILITOT, ALBUMIN in the last 168 hours.  Cardiac Enzymes No results for input(s): TROPONINI, PROBNP in the last 168 hours.  Glucose  Recent Labs Lab 11/10/16 1133 11/10/16 1625 11/10/16 2006 11/10/16 2341 11/11/16 0342 11/11/16 0757  GLUCAP 132* 101* 111* 96 111* 114*   Imaging Dg Chest Port 1 View  Result Date: 11/11/2016 CLINICAL DATA:  Acute respiratory failure, pulmonary edema, intubated patient. History of myasthenia gravis EXAM: PORTABLE CHEST 1 VIEW COMPARISON:  Portable chest x-ray of November 07, 2016 FINDINGS: The lungs are slightly better inflated today. There remain densities at both lung bases and in the perihilar regions. The heart is top-normal in size. The pulmonary  vascularity is less prominent today. The endotracheal tube tip lies approximately 1 cm above the carina. The feeding tube tip projects below the inferior margin of the image. The right sided PICC line tip projects over the junction of the middle and distal thirds of the SVC. There is calcification in the wall of the aortic arch. There degenerative changes of both shoulders. IMPRESSION: Persistent bibasilar and perihilar atelectasis or pneumonia. Small pleural effusions cannot be excluded. No overt CHF. Thoracic aortic atherosclerosis. The endotracheal tube tip lies 1 cm above the carina. Withdrawal by 2-3 cm is recommended to avoid accidental mainstem bronchus intubation with patient movement. Electronically Signed   By: David  Martinique M.D.   On: 11/11/2016 07:27  reviewed  STUDIES:    DISCUSSION: 83 y/o F with reported MG on prednisone and advanced dementia (per report, lives in a memory care unit) who was admitted 8/7 with increased SOB.  EMS attempted intubation nasally > pt vomited, concern for aspiration.  Intubated in ER.  Started plasmapheresis 8/9 for myasthenia flare  ASSESSMENT / PLAN:  PULMONARY A: Acute Hypoxic Respiratory Failure, multifactorial due to my sending a flare plus possible aspiration event Aspiration pneumonia P:   Continue to follow NIF and vital capacity Pressure support as she can tolerate. Not a candidate currently for extubation. Do not believe she is a good candidate for tracheostomy and prolonged support based on discussion with her healthcare power of attorney. We will revisit goals of care in the next 1-2 days, likely moving towards one way extubation with a transition to comfort if she cannot tolerate  CARDIOVASCULAR A:  Demand ischemia, peak Tp 0.06 Cardiogenic pulm edema P:  Continue diuretics as ordered, goal I=O continue aspirin  RENAL A: Mild Elevation of Lactic Acid. Resolved Hypokalemia  /hypophos resolved  hypernatremia P:   Continue  current free water dosing Follow BMP, urine output Replace electrolytes as indicated  GASTROINTESTINAL A:   Nausea / Vomiting - suspect related to nasal intubation attempt, resolved P:   Continue tube feeding Zofran if needed Pantoprazole for prophylaxis   HEMATOLOGIC A:   Mild Leukocytosis, resolved P:  Heparin DVT prophylaxis Follow CBC  INFECTIOUS A:   SIRS / Sepsis Aspiration  pneumonia P:   Zosyn 7 days completed on 8/13  ENDOCRINE A:   Hypothyroidism  Hyperglycemia    P:   Sliding scale insulin and CBG per protocol Continue Synthroid  NEUROLOGIC A:   Myasthenia Gravis - on baseline prednisone 5mg  QD, followed by Dr. Jaynee Eagles  Plasmapheresis for MG flare P:   RASS goal: 0 to -1  Decrease prednisone to 5 mg daily Stop scopolamine patch Continue Mestinon Completed 5 days plasmapheresis 8/14 Continue IVIG, plan 5 days (day 4 of 5) Serial  neurological exam, NIF, vital capacity Precedex as needed for sedation  FAMILY  - Updates: Reviewed patient's status with her healthcare power of attorney on 8/14. At this point all aggressive care to hopefully reverse acute process is appropriate. She did note that the patient would not want indefinite invasive support. We will have to revisit goals next 1-2 days, suspect most appropriate approach will be a one way extubation  - Inter-disciplinary family meet or Palliative Care meeting due by: 8/16  Independent CC time 75 minutes  Baltazar Apo, MD, PhD 11/11/2016, 11:57 AM [facility] [phone] or if no answer [phone]

## 2016-11-11 NOTE — Progress Notes (Signed)
[facility] Progress Note Patient Name: [name] DOB: 01/21/1931 MRN: [id number]   Date of Service  11/11/2016  HPI/Events of Note  Anxiety  Versed low dose x 1  eICU Interventions       Intervention Category Minor Interventions: Agitation / anxiety - evaluation and management  Raylene Miyamoto. 11/11/2016, 5:19 PM

## 2016-11-11 NOTE — Progress Notes (Signed)
Critical potassium of 2.5 called on the patient. CCM paged to notify

## 2016-11-11 NOTE — Progress Notes (Addendum)
Neurology progress note  Subjective: Patient seen and examined. No acute overnight events. Continues to have lowness in vital capacity.  Objective: Vitals:   11/11/16 0800 11/11/16 0900  BP: 121/65 (!) 120/55  Pulse: 80 86  Resp: (!) 27 (!) 22  Temp:    SpO2: 94% 93%  Gen.: Not sedated, intubated, in no acute distress. Respiratory: Breathing comfortably, in no distress. CVS: Regular rate rhythm Extremities: Warm well perfused Abdomen: Nondistended nontender Neurological exam Mental status: Alert, awake, follows all commands and responds appropriately with gestures. Cranial nerve: Pupils equal round reactive, extra ocular movements intact but reports diplopia at both end gazes, mild bilateral ptosis, facial symmetry difficult to ascertain. Motor exam: 4/5 in both upper and lower extremities, normal tone, decreased bulk. Sensory: Intact to light touch all over DTRs: 2+ symmetrical over with downgoing toes bilaterally  Laboratory findings CBC    Component Value Date/Time   WBC 8.5 11/10/2016 0412   RBC 2.85 (L) 11/10/2016 0412   HGB 8.7 (L) 11/10/2016 0412   HGB 14.6 12/06/2015 1640   HCT 27.5 (L) 11/10/2016 0412   HCT 43.7 12/06/2015 1640   PLT 364 11/10/2016 0412   PLT 193 12/06/2015 1640   MCV 96.5 11/10/2016 0412   MCV 92 12/06/2015 1640   MCH 30.5 11/10/2016 0412   MCHC 31.6 11/10/2016 0412   RDW 15.6 (H) 11/10/2016 0412   RDW 16.2 (H) 12/06/2015 1640   LYMPHSABS 5.0 (H) 10/29/2016 1035   MONOABS 0.8 10/29/2016 1035   EOSABS 0.1 10/29/2016 1035   BASOSABS 0.0 10/29/2016 1035   CMP     Component Value Date/Time   NA 141 11/11/2016 0411   NA 140 12/06/2015 1640   K 2.5 (LL) 11/11/2016 0411   CL 103 11/11/2016 0411   CO2 28 11/11/2016 0411   GLUCOSE 112 (H) 11/11/2016 0411   BUN 19 11/11/2016 0411   BUN 23 12/06/2015 1640   CREATININE 0.74 11/11/2016 0411   CALCIUM 9.1 11/11/2016 0411   PROT 5.8 (L) 12/06/2015 1640   ALBUMIN 3.9 12/06/2015 1640   AST 24  12/06/2015 1640   ALT 81 (H) 12/06/2015 1640   ALKPHOS 72 12/06/2015 1640   BILITOT 0.7 12/06/2015 1640   GFRNONAA >60 11/11/2016 0411   GFRAA >60 11/11/2016 0411   No new brain imaging  Current medications Current Facility-Administered Medications:  .  0.9 %  sodium chloride infusion, , Intravenous, Continuous, Mannam, Praveen, MD, Last Rate: 10 mL/hr at 11/11/16 0600 .  albuterol (PROVENTIL) (2.5 MG/3ML) 0.083% nebulizer solution 2.5 mg, 2.5 mg, Nebulization, Q6H PRN, Raylene Miyamoto, MD .  aspirin chewable tablet 81 mg, 81 mg, Per Tube, Daily, Rigoberto Noel, MD, 81 mg at 11/11/16 0901 .  benzocaine (ORAJEL) 10 % mucosal gel, , Mouth/Throat, QID PRN, Greta Doom, MD .  chlorhexidine gluconate (MEDLINE KIT) (PERIDEX) 0.12 % solution 15 mL, 15 mL, Mouth Rinse, BID, Simpson, Paula B, NP, 15 mL at 11/11/16 0900 .  Chlorhexidine Gluconate Cloth 2 % PADS 6 each, 6 each, Topical, Daily, Mannam, Praveen, MD, 6 each at 11/10/16 1630 .  cycloSPORINE (RESTASIS) 0.05 % ophthalmic emulsion 1 drop, 1 drop, Both Eyes, BID, Salvadore Dom E, NP, 1 drop at 11/11/16 0900 .  dexmedetomidine (PRECEDEX) 400 MCG/100ML (4 mcg/mL) infusion, 0.4-1.2 mcg/kg/hr, Intravenous, Titrated, Mannam, Praveen, MD, Stopped at 11/10/16 0815 .  docusate (COLACE) 50 MG/5ML liquid 100 mg, 100 mg, Per Tube, BID PRN, Erick Colace, NP, 100 mg at 11/02/16 0939 .  feeding supplement (VITAL AF 1.2 CAL) liquid 1,000 mL, 1,000 mL, Per Tube, Continuous, Mannam, Praveen, MD, Last Rate: 50 mL/hr at 11/11/16 0600, 1,000 mL at 11/11/16 0600 .  fentaNYL (SUBLIMAZE) injection 50 mcg, 50 mcg, Intravenous, Q2H PRN, Erick Colace, NP, 50 mcg at 11/11/16 0451 .  furosemide (LASIX) injection 40 mg, 40 mg, Intravenous, Q6H, Raylene Miyamoto, MD, 40 mg at 11/11/16 0900 .  Immune Globulin 10% (PRIVIGEN) IV infusion 25 g, 400 mg/kg, Intravenous, Q24 Hr x 5, Aroor, Sushanth R, MD, Last Rate: 32.6 mL/hr at 11/11/16 0600, 25 g at  11/11/16 0600 .  insulin aspart (novoLOG) injection 0-15 Units, 0-15 Units, Subcutaneous, Q4H, Erick Colace, NP, 2 Units at 11/10/16 1206 .  levothyroxine (SYNTHROID, LEVOTHROID) tablet 75 mcg, 75 mcg, Per Tube, QAC breakfast, Erick Colace, NP, 75 mcg at 11/11/16 0900 .  lipase/protease/amylase (CREON) capsule 24,000 Units, 24,000 Units, Oral, Once **AND** [COMPLETED] sodium bicarbonate tablet 650 mg, 650 mg, Oral, Once, Raylene Miyamoto, MD, 650 mg at 11/10/16 2353 .  MEDLINE mouth rinse, 15 mL, Mouth Rinse, QID, Simpson, Paula B, NP, 15 mL at 11/11/16 0440 .  ondansetron (ZOFRAN) injection 4 mg, 4 mg, Intravenous, Q6H PRN, Collene Gobble, MD, 4 mg at 11/10/16 1300 .  pantoprazole sodium (PROTONIX) 40 mg/20 mL oral suspension 40 mg, 40 mg, Per Tube, Q1200, Rigoberto Noel, MD, 40 mg at 11/10/16 1154 .  potassium chloride 20 MEQ/15ML (10%) solution 40 mEq, 40 mEq, Per Tube, Q4H, Anders Simmonds, MD, 40 mEq at 11/11/16 0915 .  predniSONE (DELTASONE) tablet 10 mg, 10 mg, Per Tube, Daily, Aroor, Karena Addison R, MD, 10 mg at 11/11/16 0900 .  pyridostigmine (MESTINON) tablet 30 mg, 30 mg, Per NG tube, Q6H, Aroor, Lanice Schwab, MD, 30 mg at 11/11/16 0506 .  pyridOXINE (VITAMIN B-6) tablet 100 mg, 100 mg, Per Tube, Daily, Salvadore Dom E, NP, 100 mg at 11/11/16 0900 .  scopolamine (TRANSDERM-SCOP) 1 MG/3DAYS 1.5 mg, 1 patch, Transdermal, Q72H, Simpson, Paula B, NP, 1.5 mg at 11/08/16 1209 .  sodium chloride flush (NS) 0.9 % injection 10-40 mL, 10-40 mL, Intracatheter, Q12H, Mannam, Praveen, MD, 10 mL at 11/11/16 0920 .  sodium chloride flush (NS) 0.9 % injection 10-40 mL, 10-40 mL, Intracatheter, PRN, Mannam, Praveen, MD  NIF: -20, VC: 515 with decent pt. Effort.   Assessment and plan 83 year old woman with a past medical history of myasthenia gravis, presented with respiratory failure. She had been complaint of progressive dysphagia prior to presentation and with abrupt worsening of her history  status. Also found to have aspiration pneumonia. Received 5 days of plasma exchange with no significant improvement and also has failed 1 extubation trial. Currently receiving IVIG therapy-today day 4/5.  Recommendations -Continue IVIG for a total of 5 days -NIFs and FVCs q8h -Treatment of infections per primary team as you are -She is on scopolamine for secretions, I would recommend discontinuing that as it could antagonize the effects of Mestinon and is known to cause worsening and myasthenic because of the antimuscarinic effects. -Continue with Mestinon-30 every 6 hours -Continue prednisone at 10 mg by mouth daily -palliative consult for [place] discussion  -- Amie Portland, MD [facility] [phone]  If 7pm to 7am, please call on call as listed on AMION.

## 2016-11-11 NOTE — Progress Notes (Signed)
[facility] ADULT ICU REPLACEMENT PROTOCOL FOR AM LAB REPLACEMENT ONLY  The patient does apply for the [facility] Adult ICU Electrolyte Replacment Protocol based on the criteria listed below:   1. Is GFR >/= 40 ml/min? Yes.    Patient's GFR today is >60 2. Is urine output >/= 0.5 ml/kg/hr for the last 6 hours? Yes.   Patient's UOP is 2.3 ml/kg/hr 3. Is BUN < 60 mg/dL? Yes.    Patient's BUN today is 19 4. Abnormal electrolyte(s): k 2.5 5. Ordered repletion with: protocol 6. If a panic level lab has been reported, has the CCM MD in charge been notified? No..   Physician:    Ronda Fairly A 11/11/2016 6:14 AM

## 2016-11-11 NOTE — Progress Notes (Signed)
Physical Therapy Treatment Patient Details Name: [name] MRN: [id number] DOB: Aug 22, 1930 Today's Date: 11/11/2016    History of Present Illness Pt is an 83 y/o female with a significant PMH of myathenia gravis and dementia. She presented from SNF (memory care unit) with increasing SOB and confusion. She was intubated in the ED. Pt was admitted for myasthenia flare and aspiration PNA.     PT Comments    Pt progressing slowly towards physical therapy goals.  Pt adamantly declining any mobility with therapy today, however was agreeable to repositioning in the bed. Asking for water; attempted to wet mouth with moisturizer but pt began coughing/gagging. Will continue to follow and progress as able per POC.   Follow Up Recommendations  SNF;Supervision/Assistance - 24 hour     Equipment Recommendations  None recommended by PT    Recommendations for Other Services       Precautions / Restrictions Precautions Precautions: Fall Precaution Comments: Intubated Restrictions Weight Bearing Restrictions: No    Mobility  Bed Mobility Overal bed mobility: Needs Assistance             General bed mobility comments: Pt keeping covers pulled up over her torso, not letting therapists take them off. Demonstrated fairly good grip strength on sheets. Pt agreeable to repositioning in bed, however not agreeable to attempt transfer to EOB. Pt demonstrated bilateral knee flexion and ability to assist in pushing herself up as therapists advanced pt towards [facility] with bed pad.   Transfers                 General transfer comment: Unable  Ambulation/Gait             General Gait Details: Unable   Stairs            Wheelchair Mobility    Modified Rankin (Stroke Patients Only)       Balance       Sitting balance - Comments: Unable to be assessed this session                                    Cognition Arousal/Alertness: Awake/alert Behavior  During Therapy: Anxious Overall Cognitive Status: History of cognitive impairments - at baseline                                 General Comments: memory care unit at baseline      Exercises      General Comments        Pertinent Vitals/Pain Pain Assessment: Faces Faces Pain Scale: Hurts little more Pain Location: Indicated mouth, nose, throat, head Pain Descriptors / Indicators: Grimacing;Discomfort Pain Intervention(s): Monitored during session;Repositioned    Home Living                      Prior Function            PT Goals (current goals can now be found in the care plan section) Acute Rehab PT Goals Patient Stated Goal: none stated PT Goal Formulation: Patient unable to participate in goal setting Time For Goal Achievement: 11/21/16 Potential to Achieve Goals: Fair Progress towards PT goals: Progressing toward goals    Frequency    Min 3X/week      PT Plan Current plan remains appropriate    Co-evaluation PT/OT/SLP Co-Evaluation/Treatment: Yes Reason for Co-Treatment:  For patient/therapist safety;To address functional/ADL transfers PT goals addressed during session: Mobility/safety with mobility        AM-PAC PT "6 Clicks" Daily Activity  Outcome Measure  Difficulty turning over in bed (including adjusting bedclothes, sheets and blankets)?: Unable Difficulty moving from lying on back to sitting on the side of the bed? : Unable Difficulty sitting down on and standing up from a chair with arms (e.g., wheelchair, bedside commode, etc,.)?: Unable Help needed moving to and from a bed to chair (including a wheelchair)?: Total Help needed walking in hospital room?: Total Help needed climbing 3-5 steps with a railing? : Total 6 Click Score: 6    End of Session Equipment Utilized During Treatment: Oxygen Activity Tolerance: Patient limited by fatigue;Patient limited by pain Patient left: in bed;with call bell/phone within  reach Nurse Communication: Mobility status PT Visit Diagnosis: Muscle weakness (generalized) (M62.81);Other abnormalities of gait and mobility (R26.89)     Time: 2423-5361 PT Time Calculation (min) (ACUTE ONLY): 15 min  Charges:  $Therapeutic Activity: 8-22 mins                    G Codes:       Rolinda Roan, PT, DPT Acute Rehabilitation Services Pager: [phone]   [name] 11/11/2016, 12:49 PM

## 2016-11-11 NOTE — Progress Notes (Signed)
NIF -10, VC .4L Patient did not give good effort and refused to try again

## 2016-11-11 NOTE — Progress Notes (Addendum)
Nutrition Follow-up  INTERVENTION:   Continue Vital 1.2 @50  ml/hr Provides: 1440 kcal, 90 grams protein, and 973 ml free water  NUTRITION DIAGNOSIS:   Inadequate oral intake related to inability to eat as evidenced by NPO status. Ongoing.   GOAL:   Provide needs based on ASPEN/SCCM guidelines Progressing.   MONITOR:   Vent status, Labs, Weight trends, TF tolerance, I & O's  ASSESSMENT:   Pt with PMH of dementia (resides at memory care unit at [facility]) and  myasthenia gravis on prednisone daily admitted with SOB. Nasotracheal intubation attempted but unsuccessful and resulted in emesis with possible aspiration PNA.    Pt extubated 8/14; did not tolerate procedure and re-intubated. PICC placed 8/17. Cortrak placed 8/15. Pt is tolerating tube feeds well. Per chart, pt with initial wt gain after admit but wt back down today. Pt is on Lasix. Pt with hypokalemia; monitor and supplement as needed per MD discretion.   Medications reviewed and include: aspirin, lasix, insulin, synthroid, protonix, KCl, prednisone, Vit B-6, fentanyl  Labs reviewed: K 2.5(L), P 2.9 wnl, Mg 2.1 wnl Hgb 8.7(L), Hct 27.5(L)  Patient is currently intubated on ventilator support MV: 7.9 L/min Temp (24hrs), Avg:98.9 F (37.2 C), Min:98.1 F (36.7 C), Max:99.5 F (37.5 C)  Propofol: none  MAP- 66-75mmHg  Diet Order:  Diet NPO time specified  Skin:  Reviewed, no issues  Last BM:  8/18  Height:   Ht Readings from Last 1 Encounters:  10/31/16 5\' 1"  (1.549 m)    Weight:   Wt Readings from Last 1 Encounters:  11/11/16 132 lb 7.9 oz (60.1 kg)    Ideal Body Weight:  47.7 kg  BMI:  Body mass index is 25.03 kg/m.  Estimated Nutritional Needs:   Kcal:  1469  Protein:  75-90 grams  Fluid:  > 1.5 L/day  EDUCATION NEEDS:   No education needs identified at this time  Koleen Distance MS, RD, [place] Pager #- [phone] After Hours Pager: [phone]

## 2016-11-12 DIAGNOSIS — Z515 Encounter for palliative care: Secondary | ICD-10-CM

## 2016-11-12 DIAGNOSIS — Z66 Do not resuscitate: Secondary | ICD-10-CM

## 2016-11-12 DIAGNOSIS — G7000 Myasthenia gravis without (acute) exacerbation: Secondary | ICD-10-CM

## 2016-11-12 LAB — GLUCOSE, CAPILLARY
GLUCOSE-CAPILLARY: 164 mg/dL — AB (ref 65–99)
GLUCOSE-CAPILLARY: 123 mg/dL — AB (ref 65–99)
Glucose-Capillary: 142 mg/dL — ABNORMAL HIGH (ref 65–99)

## 2016-11-12 LAB — BASIC METABOLIC PANEL
ANION GAP: 8 (ref 5–15)
BUN: 33 mg/dL — AB (ref 6–20)
CHLORIDE: 103 mmol/L (ref 101–111)
CO2: 31 mmol/L (ref 22–32)
Calcium: 9.4 mg/dL (ref 8.9–10.3)
Creatinine, Ser: 0.71 mg/dL (ref 0.44–1.00)
GFR calc Af Amer: >60 mL/min (ref >60)
GLUCOSE: 103 mg/dL — AB (ref 65–99)
POTASSIUM: 3.6 mmol/L (ref 3.5–5.1)
Sodium: 142 mmol/L (ref 135–145)

## 2016-11-12 MED ORDER — MORPHINE SULFATE (PF) 4 MG/ML IV SOLN
2.0000 mg | INTRAVENOUS | Status: DC | PRN
  Administered 2016-11-12 – 2016-11-13 (×5): 2 mg via INTRAVENOUS
  Filled 2016-11-12 (×5): qty 1

## 2016-11-12 MED ORDER — LORAZEPAM 2 MG/ML IJ SOLN
INTRAMUSCULAR | Status: AC
  Administered 2016-11-12: 1 mg via INTRAVENOUS
  Filled 2016-11-12: qty 1

## 2016-11-12 MED ORDER — LORAZEPAM 2 MG/ML IJ SOLN
1.0000 mg | INTRAMUSCULAR | Status: DC | PRN
Start: 2016-11-12 — End: 2016-11-13
  Administered 2016-11-12 – 2016-11-13 (×3): 1 mg via INTRAVENOUS
  Filled 2016-11-12 (×2): qty 1

## 2016-11-12 MED ORDER — GLYCOPYRROLATE 0.2 MG/ML IJ SOLN
0.4000 mg | Freq: Three times a day (TID) | INTRAMUSCULAR | Status: DC
  Administered 2016-11-12 (×2): 0.4 mg via INTRAVENOUS
  Filled 2016-11-12 (×2): qty 2

## 2016-11-12 MED ORDER — VITAL AF 1.2 CAL PO LIQD: 1000.0000 mL | ORAL | Status: DC

## 2016-11-12 NOTE — Progress Notes (Signed)
NEUROLOGY PROGRESS NOTE  S: Seen and examined this am. Reports no new complaints. Reports still feeling weak all over. Patient not very cooperative with the respitatory therapists. Low NIFs and FVCs.  O: Vitals:   11/12/16 0800 11/12/16 0900  BP:  (!) 137/53  Pulse:  75  Resp:  (!) 21  Temp: 98 F (36.7 C)   SpO2:  94%  Gen.: Not sedated, intubated, in no acute distress. Respiratory: Breathing comfortably, in no distress. CVS: Regular rate rhythm Extremities: Warm well perfused Abdomen: Nondistended nontender Neurological exam Mental status: Alert, awake, follows all commands and responds appropriately with gestures. Cranial nerve: Pupils equal round reactive, extra ocular movements intact but reports diplopia at both end gazes, mild bilateral ptosis, facial symmetry difficult to ascertain. Motor exam: 4/5 in both upper and lower extremities, normal tone, decreased bulk. Sensory: Intact to light touch all over DTRs: 2+ symmetrical over with downgoing toes bilaterally  Labs CBC    Component Value Date/Time   WBC 8.5 11/10/2016 0412   RBC 2.85 (L) 11/10/2016 0412   HGB 8.7 (L) 11/10/2016 0412   HGB 14.6 12/06/2015 1640   HCT 27.5 (L) 11/10/2016 0412   HCT 43.7 12/06/2015 1640   PLT 364 11/10/2016 0412   PLT 193 12/06/2015 1640   MCV 96.5 11/10/2016 0412   MCV 92 12/06/2015 1640   MCH 30.5 11/10/2016 0412   MCHC 31.6 11/10/2016 0412   RDW 15.6 (H) 11/10/2016 0412   RDW 16.2 (H) 12/06/2015 1640   LYMPHSABS 5.0 (H) 10/29/2016 1035   MONOABS 0.8 10/29/2016 1035   EOSABS 0.1 10/29/2016 1035   BASOSABS 0.0 10/29/2016 1035   CMP     Component Value Date/Time   NA 142 11/12/2016 0616   NA 140 12/06/2015 1640   K 3.6 11/12/2016 0616   CL 103 11/12/2016 0616   CO2 31 11/12/2016 0616   GLUCOSE 103 (H) 11/12/2016 0616   BUN 33 (H) 11/12/2016 0616   BUN 23 12/06/2015 1640   CREATININE 0.71 11/12/2016 0616   CALCIUM 9.4 11/12/2016 0616   PROT 5.8 (L) 12/06/2015 1640   ALBUMIN 3.9 12/06/2015 1640   AST 24 12/06/2015 1640   ALT 81 (H) 12/06/2015 1640   ALKPHOS 72 12/06/2015 1640   BILITOT 0.7 12/06/2015 1640   GFRNONAA >60 11/12/2016 0616   GFRAA >60 11/12/2016 2992   Current meds  Current Facility-Administered Medications:  .  0.9 %  sodium chloride infusion, , Intravenous, Continuous, Mannam, Praveen, MD, Last Rate: 10 mL/hr at 11/12/16 0800 .  albuterol (PROVENTIL) (2.5 MG/3ML) 0.083% nebulizer solution 2.5 mg, 2.5 mg, Nebulization, Q6H PRN, Raylene Miyamoto, MD .  aspirin chewable tablet 81 mg, 81 mg, Per Tube, Daily, Rigoberto Noel, MD, 81 mg at 11/12/16 0907 .  benzocaine (ORAJEL) 10 % mucosal gel, , Mouth/Throat, QID PRN, Greta Doom, MD .  chlorhexidine gluconate (MEDLINE KIT) (PERIDEX) 0.12 % solution 15 mL, 15 mL, Mouth Rinse, BID, Mannam, Praveen, MD, 15 mL at 11/12/16 0739 .  Chlorhexidine Gluconate Cloth 2 % PADS 6 each, 6 each, Topical, Daily, Mannam, Praveen, MD, 6 each at 11/10/16 1630 .  cycloSPORINE (RESTASIS) 0.05 % ophthalmic emulsion 1 drop, 1 drop, Both Eyes, BID, Erick Colace, NP, 1 drop at 11/12/16 0907 .  dexmedetomidine (PRECEDEX) 400 MCG/100ML (4 mcg/mL) infusion, 0.4-1.2 mcg/kg/hr, Intravenous, Titrated, Mannam, Praveen, MD, Stopped at 11/10/16 0815 .  docusate (COLACE) 50 MG/5ML liquid 100 mg, 100 mg, Per Tube, BID PRN, Erick Colace,  NP, 100 mg at 11/12/16 0749 .  feeding supplement (VITAL AF 1.2 CAL) liquid 1,000 mL, 1,000 mL, Per Tube, Continuous, Mannam, Praveen, MD, Last Rate: 50 mL/hr at 11/12/16 0700, 1,000 mL at 11/12/16 0700 .  fentaNYL (SUBLIMAZE) injection 50 mcg, 50 mcg, Intravenous, Q2H PRN, Erick Colace, NP, 50 mcg at 11/12/16 0749 .  furosemide (LASIX) injection 40 mg, 40 mg, Intravenous, Q6H, Raylene Miyamoto, MD, 40 mg at 11/12/16 0749 .  Immune Globulin 10% (PRIVIGEN) IV infusion 25 g, 400 mg/kg, Intravenous, Q24 Hr x 5, Aroor, Karena Addison R, MD, Last Rate: 33 mL/hr at 11/11/16 1609, 25 g  at 11/11/16 1609 .  insulin aspart (novoLOG) injection 0-15 Units, 0-15 Units, Subcutaneous, Q4H, Erick Colace, NP, 3 Units at 11/12/16 0900 .  levothyroxine (SYNTHROID, LEVOTHROID) tablet 75 mcg, 75 mcg, Per Tube, QAC breakfast, Erick Colace, NP, 75 mcg at 11/12/16 0749 .  lipase/protease/amylase (CREON) capsule 24,000 Units, 24,000 Units, Oral, Once **AND** [COMPLETED] sodium bicarbonate tablet 650 mg, 650 mg, Oral, Once, Raylene Miyamoto, MD, 650 mg at 11/10/16 2353 .  MEDLINE mouth rinse, 15 mL, Mouth Rinse, 10 times per day, Mannam, Praveen, MD, 15 mL at 11/12/16 0908 .  ondansetron (ZOFRAN) injection 4 mg, 4 mg, Intravenous, Q6H PRN, Collene Gobble, MD, 4 mg at 11/10/16 1300 .  pantoprazole sodium (PROTONIX) 40 mg/20 mL oral suspension 40 mg, 40 mg, Per Tube, Q1200, Rigoberto Noel, MD, 40 mg at 11/11/16 1242 .  predniSONE (DELTASONE) tablet 5 mg, 5 mg, Per Tube, Daily, Collene Gobble, MD, 5 mg at 11/12/16 0907 .  pyridostigmine (MESTINON) tablet 30 mg, 30 mg, Per NG tube, Q6H, Aroor, Karena Addison R, MD, 30 mg at 11/12/16 0600 .  pyridOXINE (VITAMIN B-6) tablet 100 mg, 100 mg, Per Tube, Daily, Erick Colace, NP, 100 mg at 11/12/16 0907 .  sodium chloride flush (NS) 0.9 % injection 10-40 mL, 10-40 mL, Intracatheter, Q12H, Mannam, Praveen, MD, 10 mL at 11/12/16 0908 .  sodium chloride flush (NS) 0.9 % injection 10-40 mL, 10-40 mL, Intracatheter, PRN, Mannam, Praveen, MD  Respiratory measurements:  1923 hrs 8/20: NIF -10, VC .4L Patient did not give good effort and refused to try again This AM - reported NIF -12. VC .38 not a good effort (per verbal sign out - official report pending)  Assessment: 83 year old woman with a past medical history of myasthenia gravis, presented with respiratory failure. She had been complaint of progressive dysphagia prior to presentation and with abrupt worsening of her history status. Also found to have aspiration pneumonia. Received 5 days of plasma  exchange with no significant improvement and also has failed 1 extubation trial. Currently receiving IVIG therapy-today day 5/5.  Impression MG crisis Acute respiratory failure  Recommendations -Continue IVIG for a total of 5 days. -After 5d of IVIG are done, we can increase steroids to high dose (36m/day) as she has in the past been reluctant to go on steroid sparing agents, and high dose steroid will be our only option for maintenance therapy. -NIFs and FVCs q8h -Treatment of infections per primary team as you are -Continue with Mestinon-30 every 6 hours -Continue prednisone at 10 mg by mouth daily -palliative consult for GLunddiscussion -Was able to talk to outpatient neurologist Dr. AJaynee Eagles who in turn spoke with MPOA Ms. EDutch Quint After a lengthy discussion between Dr. AJaynee Eaglesand Ms. BKalman Shan Ms. BKalman Shanhas expressed that she would not like the patient to be trached. She would  like to speak with palliative team and would not want the patient back at the [facility] facility. She wants extubation done at the hospital and possible end of life care at the hospital itself. Discussed with Dr Lamonte Sakai and called palliative consult. Waiting callback from palliative care team.   -- Amie Portland, MD [facility] [phone]  If 7pm to 7am, please call on call as listed on AMION.

## 2016-11-12 NOTE — Progress Notes (Signed)
Pt extubated, per order, to 4L N/C.  No stridor noted RN at bedside.  Pt not in distress at this time but appears confused, does not communicate or follow commands.

## 2016-11-12 NOTE — Consult Note (Signed)
Consultation Note Date: 11/12/2016   Patient Name: [name]  DOB: July 19, 1930  MRN: [id number]  Age / Sex: 83 y.o., female  PCP: Lajean Manes, MD Referring Physician: Marshell Garfinkel, MD  Reason for Consultation: Establishing goals of care, Non pain symptom management, Pain control and Psychosocial/spiritual support  HPI/Patient Profile: 83 y.o. female   admitted on 10/29/2016 with a known history of myasthenia gravis, plasmapheresis on August 9 for myasthenia flare.  She lives at [facility]/memory care.  Admitted Via EMS from her facility with reports of increased shortness of breath and confusion  Per documentation patient has been short of breath and during transport by EMS patient was combative and hypoxic. EMS attempted nasotracheal intubation which was unsuccessful and resulted and emesis with possible aspiration.   Patient was hypoxic upon arrival to the emergency department and was subsequently intubated by the emergency department physician.   In spite of 10 day aggressive medical interventions has made no real meaningful recovery.  Patient is extubated today  HPOA faces advanced directive decisions.      Clinical Assessment and Goals of Care:   This NP Wadie Lessen reviewed medical records, received report from team, assessed the patient ( gasping, dyspneic, audible secretions, wide eyes) and then spoke to her friend and HPOA Ms Kalman Shan  to discuss diagnosis, prognosis, GOC, EOL wishes disposition and options.  A  discussion was had today regarding advanced directives.  Concepts specific to code status, artifical feeding and hydration, continued IV antibiotics and rehospitalization was had.  The difference between a aggressive medical intervention path  and a palliative comfort care path for this patient at this time was had.  Values and goals of care important to patient  were attempted to  be elicited.  HPOA verbalizes clearly that this patient had been declining physically functionally and cognitively over the past many months.  She was not happy with her current quality of life. She did verbalize many times in the past that she was "ready to go"   HPOA  understands the seriousness of his situation, and the likelihood that the patient will not survive to a level of meaningful quality of life.  Her hope is for comfort, quality and dignity.  She understands that anything can happen at any time and that it is likely that this patient will not survive this hospitalization.  Concept of Palliative Care was discussed  Plan is to shift to a full comfort path  Questions and concerns addressed.   Family encouraged to call with questions or concerns.  PMT will continue to support holistically.   HCPOA-documented Barbarann Ehlers     SUMMARY OF RECOMMENDATIONS    Code Status/Advance Care Planning:  DNR   Symptom Management:   Hopefully patient can remain in current room through the night.  Dyspnea- Morphine IV 2 mg every 2 hours when necessary  Terminal Secretions- Robinol IV 4 mg  3 times a day/ NT suctioning  Agitation-Ativan 1 mg IV every 4 hours when necessary  Palliative Prophylaxis:  Aspiration, Frequent Pain Assessment and Oral Care  Additional Recommendations (Limitations, Scope, Preferences):  Full Comfort Care  Psycho-social/Spiritual:   Desire for further Chaplaincy support:no  Additional Recommendations: Grief/Bereavement Support  Prognosis:   Hours - Days  Discharge Planning: Anticipated Hospital Death      Primary Diagnoses: Present on Admission: **None**   I have reviewed the medical record, interviewed the patient and family, and examined the patient. The following aspects are pertinent.  Past Medical History:  Diagnosis Date  . Arthritis   . Diverticulosis of colon   . GERD (gastroesophageal reflux disease)   . H/O hiatal hernia     . Hemorrhoid   . Macular degeneration   . Osteopenia   . Seizures ([place])    X1 AT AGE 75  - NONE SINCE  . Thyroid disease    Social History   Social History  . Marital status: Single    Spouse name: N/A  . Number of children: 0  . Years of education: BA   Occupational History  . Retired    Social History Main Topics  . Smoking status: Never Smoker  . Smokeless tobacco: Never Used  . Alcohol use No  . Drug use: No  . Sexual activity: Not Currently   Other Topics Concern  . None   Social History Narrative   Lives at [place]   Phone: [phone]   Fax: [phone]      Uses pharmacy at [facility]:    Pharmacy number: [phone]   Fax: [phone]      Caffeine use: No soda   Hot tea ocass   Coffee-decaf            Family History  Problem Relation Age of Onset  . Cancer Mother   . Heart attack Father   . Prostate cancer Brother    Scheduled Meds: . chlorhexidine gluconate (MEDLINE KIT)  15 mL Mouth Rinse BID  . Chlorhexidine Gluconate Cloth  6 each Topical Daily  . cycloSPORINE  1 drop Both Eyes BID  . furosemide  40 mg Intravenous Q6H  . Immune Globulin 10%  400 mg/kg Intravenous Q24 Hr x 5  . LORazepam      . mouth rinse  15 mL Mouth Rinse 10 times per day  . sodium chloride flush  10-40 mL Intracatheter Q12H   Continuous Infusions: . sodium chloride 10 mL/hr at 11/12/16 0800  . feeding supplement (VITAL AF 1.2 CAL)     PRN Meds:.albuterol, benzocaine, LORazepam, morphine injection, ondansetron (ZOFRAN) IV, sodium chloride flush Medications Prior to Admission:  Prior to Admission medications   Medication Sig Start Date End Date Taking? Authorizing Provider  acetaminophen (TYLENOL) 500 MG tablet Take 500 mg by mouth 3 (three) times daily.    Yes [provider]  alendronate (FOSAMAX) 70 MG tablet Take 70 mg by mouth once a week. Take with a full glass of water on an empty stomach.   Yes [provider]  cycloSPORINE (RESTASIS) 0.05 % ophthalmic emulsion Place 1 drop into both eyes 2 (two) times daily.   Yes [provider]  furosemide (LASIX) 20 MG tablet Take 20 mg by mouth daily.   Yes [provider]  levothyroxine (SYNTHROID, LEVOTHROID) 75 MCG tablet Take 75 mcg by mouth every morning.   Yes [provider]  LORazepam (ATIVAN) 0.5 MG tablet Take 0.5 mg by mouth at bedtime.    Yes [provider]  Multiple  Vitamins-Minerals (PRESERVISION AREDS 2) CAPS Take 1 capsule by mouth daily.    Yes [provider]  predniSONE (DELTASONE) 10 MG tablet Take 10 mg by mouth daily with breakfast.   Yes [provider]  Probiotic Product (ALIGN) 4 MG CAPS Take 1 capsule by mouth daily.    Yes [provider]  pyridOXINE (VITAMIN B-6) 100 MG tablet Take 100 mg by mouth 2 (two) times daily.    Yes [provider]  ranitidine (ZANTAC) 75 MG tablet Take 75 mg by mouth 2 (two) times daily.   Yes [provider]  Vitamin D, Ergocalciferol, (DRISDOL) 50000 UNITS CAPS Take 50,000 Units by mouth every 7 (seven) days.    Yes [provider]   Allergies  Allergen Reactions  . Ivp Dye [Iodinated Diagnostic Agents] Shortness Of Breath  . Metrizamide Shortness Of Breath  . Red Dye    Review of Systems  Unable to perform ROS: Acuity of condition    Physical Exam  Constitutional: She appears listless. She appears ill.  -frail, elderly  HENT:  Audible throat secretions  Pulmonary/Chest: Tachypnea noted. She has decreased breath sounds in the right lower field and the left lower field. She has rhonchi.  Neurological: She appears listless.  Skin: Skin is warm and dry.    Vital Signs: BP (!) 114/50   Pulse 80   Temp 97.6 F (36.4 C) (Axillary)   Resp 18   Ht _0  (1.549 m)   Wt 58.7 kg (129 lb 4.8 oz)   SpO2 100%   BMI 24.43 kg/m  Pain Assessment: CPOT   Pain Score: Asleep   SpO2: SpO2: 100 % O2  Device:SpO2: 100 % O2 Flow Rate: .O2 Flow Rate (L/min): 40 L/min  IO: Intake/output summary:  Intake/Output Summary (Last 24 hours) at 11/12/16 1426 Last data filed at 11/12/16 1000  Gross per 24 hour  Intake          2609.94 ml  Output             3725 ml  Net         -1115.06 ml    LBM: Last BM Date: 11/09/16 Baseline Weight: Weight: 61.2 kg (135 lb) Most recent weight: Weight: 58.7 kg (129 lb 4.8 oz)     Palliative Assessment/Data: 20%   Discussed with Marni Griffon NP  Time In: 1415 Time Out: 1530 Time Total: 75 min Greater than 50%  of this time was spent counseling and coordinating care related to the above assessment and plan.  Signed by: Wadie Lessen, NP   Please contact Palliative Medicine Team phone at [phone] for questions and concerns.  For individual provider: See Shea Evans

## 2016-11-12 NOTE — Progress Notes (Signed)
Physical Therapy Treatment Patient Details Name: [name] MRN: [id number] DOB: 09-02-1930 Today's Date: 11/12/2016    History of Present Illness Pt is an 83 y/o female with a significant PMH of myathenia gravis and dementia. She presented from SNF (memory care unit) with increasing SOB and confusion. She was intubated in the ED. Pt was admitted for myasthenia flare and aspiration PNA.     PT Comments    Pt deferring a lot of treatment both medically and from therapy stand point. Pt appears to be in a depressed spirits. Pt wrote on paper "light go away", "free" in response to what do you want and "tired". Pt did site EOB x5 min this date but them impulsively returned to supine. Acute PT to con't to follow.  Follow Up Recommendations  SNF;Supervision/Assistance - 24 hour     Equipment Recommendations  None recommended by PT    Recommendations for Other Services OT consult     Precautions / Restrictions Precautions Precautions: Fall Precaution Comments: intubated Restrictions Weight Bearing Restrictions: No    Mobility  Bed Mobility Overal bed mobility: Needs Assistance Bed Mobility: Supine to Sit;Sit to Supine     Supine to sit: Mod assist;+2 for physical assistance;HOB elevated Sit to supine: Mod assist;+2 for physical assistance   General bed mobility comments: Pt able to initiated LE movement to EOB, s/p max encouragement. Pt used R UE but not L UE. modAx2 to scoot to EOB in optimal position with feet on the floow and to manage lines  Transfers                 General transfer comment: unable, due to pt adamance of not getting up to chair  Ambulation/Gait             General Gait Details: unable   Stairs            Wheelchair Mobility    Modified Rankin (Stroke Patients Only)       Balance Overall balance assessment: Needs assistance Sitting-balance support: Bilateral upper extremity supported;Feet supported Sitting balance-Leahy  Scale: Fair Sitting balance - Comments: pt sate EOB with minA while performing face hygiene with washcloth. PT provided hygiene to patience back                                    Cognition Arousal/Alertness: Awake/alert Behavior During Therapy: Anxious Overall Cognitive Status: History of cognitive impairments - at baseline                                 General Comments: Memory care unit at baseline. Able to follow commands and mouth responses to questions over vent. Pt able to indicate where she has pain.       Exercises      General Comments        Pertinent Vitals/Pain Pain Assessment: Faces Faces Pain Scale: No hurt Pain Location: sensitivity over skin tears Pain Descriptors / Indicators: Grimacing Pain Intervention(s): Monitored during session    Home Living                      Prior Function            PT Goals (current goals can now be found in the care plan section) Acute Rehab PT Goals Patient Stated Goal: pt wrote on paper "  light go away" and "free" and "tired" Progress towards PT goals: Progressing toward goals    Frequency    Min 3X/week      PT Plan Current plan remains appropriate    Co-evaluation              AM-PAC PT "6 Clicks" Daily Activity  Outcome Measure  Difficulty turning over in bed (including adjusting bedclothes, sheets and blankets)?: Unable Difficulty moving from lying on back to sitting on the side of the bed? : Unable Difficulty sitting down on and standing up from a chair with arms (e.g., wheelchair, bedside commode, etc,.)?: Unable Help needed moving to and from a bed to chair (including a wheelchair)?: Total Help needed walking in hospital room?: Total Help needed climbing 3-5 steps with a railing? : Total 6 Click Score: 6    End of Session Equipment Utilized During Treatment:  (intubated) Activity Tolerance:  (self limiting) Patient left: in bed;with call bell/phone  within reach;with nursing/sitter in room Nurse Communication: Mobility status PT Visit Diagnosis: Muscle weakness (generalized) (M62.81);Other abnormalities of gait and mobility (R26.89)     Time: 3729-0211 PT Time Calculation (min) (ACUTE ONLY): 27 min  Charges:  $Therapeutic Activity: 23-37 mins                    G Codes:       Kittie Plater, PT, DPT Pager #: [phone] Office #: [phone]    Clairessa Boulet M Christian Treadway 11/12/2016, 10:45 AM

## 2016-11-12 NOTE — Progress Notes (Signed)
PULMONARY / CRITICAL CARE MEDICINE   Name: [name] MRN: [id number] DOB: 1931/03/14    ADMISSION DATE:  10/29/2016 CONSULTATION DATE:  10/29/16  REFERRING MD:  Dr. Laverta Baltimore / EDP   CHIEF COMPLAINT:  SOB  HISTORY OF PRESENT ILLNESS:   83 y/o F who presented to [facility] on [facility]/7 via EMS from a SNF with reports of increasing shortness of breath and confusion.  Hx Myasthenia Gravis (baseline prednisone 5mg , followed by Dr. Jaynee Eagles) and dementia > lives in a memory care unit.   She was combative and hypoxic with EMS who apparently attempted nasal intubation which was unsuccessful. Saturations were in the 70's on NRB.  She also vomited in route with concern for possible aspiration.  BVM ventilation provided until arrival to ER. On arrival to ER, she unresponsive and was intubated for airway protection.   STUdies Echo 8/10 nml EF, RVSP 48  CULTURES:  BCx2 8/7 >> ng UC 8/7 >> ng  ANTIBIOTICS: Vanco 8/7 >> 8/10 Zosyn 8/7 >> 8/13  SIGNIFICANT EVENTS: 8/07  Admit with SOB, intubated   LINES/TUBES: ETT 8/7 >> 8/14; 8/14 >> 8/21 Rt IJ HD cath 8/8 >> 8/17 RUE PICC 8/17 >>   SUBJECTIVE: Awake and interactive   VITAL SIGNS: BP (!) 137/53   Pulse 75   Temp 98 F (36.7 C) (Axillary)   Resp (!) 21   Ht 5\' 1"  (1.549 m)   Wt 129 lb 4.8 oz (58.7 kg)   SpO2 94%   BMI 24.43 kg/m   HEMODYNAMICS:    VENTILATOR SETTINGS: Vent Mode: CPAP;PSV FiO2 (%):  [40 %] 40 % Set Rate:  [12 bmp] 12 bmp Vt Set:  [500 mL] 500 mL PEEP:  [5 cmH20] 5 cmH20 Pressure Support:  [10 cmH20] 10 cmH20 Plateau Pressure:  [17 cmH20-24 cmH20] 24 cmH20  INTAKE / OUTPUT: I/O last 3 completed shifts: In: 2850.5 [I.V.:1050.5; NG/GT:1800] Out: 75 [Urine:4750]  PHYSICAL EXAMINATION:. General appearance:  83 Year old female, well nourished NAD, Eyes: anicteric sclerae, moist conjunctivae; PERRL, EOMI bilaterally. Mouth:  membranes and no mucosal ulcerations; orally intubated Neck: Trachea  midline; neck supple, no JVD Lungs/chest: scattered rhonchi, with normal respiratory effort and no intercostal retractions CV: RRR, no MRGs  Abdomen: Soft, non-tender; no masses or HSM Extremities: No peripheral edema or extremity lymphadenopathy Skin: Normal temperature, turgor and texture; no rash, ulcers or subcutaneous nodules Psych: agitated affect at times, alert and oriented to person, place and time   LABS:  BMET  Recent Labs Lab 11/11/16 0411 11/11/16 1942 11/12/16 0616  NA 141 140 142  K 2.5* 4.4 3.6  CL 103 104 103  CO2 28 27 31   BUN 19 29* 33*  CREATININE 0.74 0.74 0.71  GLUCOSE 112* 134* 103*    Electrolytes  Recent Labs Lab 11/08/16 0403 11/10/16 0412 11/11/16 0411 11/11/16 1942 11/12/16 0616  CALCIUM 8.3* 8.6* 9.1 9.2 9.4  MG 2.2 1.9 2.1  --   --   PHOS  --  3.7 2.9  --   --     CBC  Recent Labs Lab 11/06/16 0407 11/06/16 1353 11/10/16 0412  WBC 8.5  --  8.5  HGB 9.8* 9.9* 8.7*  HCT 30.5* 29.0* 27.5*  PLT 242  --  364    Coag's No results for input(s): APTT, INR in the last 168 hours.  Sepsis Markers No results for input(s): LATICACIDVEN, PROCALCITON, O2SATVEN in the last 168 hours.  ABG  Recent Labs Lab 11/05/16 1824  PHART 7.467*  PCO2ART 38.6  PO2ART 118.0*    Liver Enzymes No results for input(s): AST, ALT, ALKPHOS, BILITOT, ALBUMIN in the last 168 hours.  Cardiac Enzymes No results for input(s): TROPONINI, PROBNP in the last 168 hours.  Glucose  Recent Labs Lab 11/11/16 1142 11/11/16 1613 11/11/16 1956 11/11/16 2355 11/12/16 0342 11/12/16 0822  GLUCAP 144* 147* 129* 125* 142* 164*   Imaging No results found.reviewed  STUDIES:    DISCUSSION: 83 y/o F with reported MG on prednisone and advanced dementia (per report, lives in a memory care unit) who was admitted 8/7 with increased SOB.  EMS attempted intubation nasally > pt vomited, concern for aspiration.  Intubated in ER. Now s/p 5 rounds plasma  phoresis. 5 days IVIG. Not sure if her NIF is accurate w/ dementia. Will extubate. Cont current level of care.  No escalation IF gets worse then we will transition to comfort.   ASSESSMENT / PLAN:  PULMONARY A: Acute Hypoxic Respiratory Failure, multifactorial due to my sending a flare plus possible aspiration event Aspiration pneumonia PCXR ett good position. Some improvement. Basilar atx persists.  P:   Extubate DNR/DNI Wean FIO2 Aspiration precautions No escalation if fails   CARDIOVASCULAR A:  Demand ischemia, peak Tp 0.06 Cardiogenic pulm edema P:  Cont tele Aiming for equal volume status   RENAL A: At risk for fluid and electrolyte Imbalance  P:   Cont I&O Replace lytes as indicated  GASTROINTESTINAL A:   Nausea / Vomiting - suspect related to nasal intubation attempt, resolved P:   Continue tube feeds Zofran PRN Cont PPI  HEMATOLOGIC A:   Mild Leukocytosis, resolved P:  Nile heparin for DVT prophylaxis Trend CBC  INFECTIOUS A:   SIRS / Sepsis Aspiration  Pneumonia s/p rx X 7d P:   Trend fever and WBC curve   ENDOCRINE A:   Hypothyroidism  Hyperglycemia    P:   Cont ssi Cont synthroid   NEUROLOGIC A:   Myasthenia Gravis - on baseline prednisone 5mg  QD, followed by Dr. Jaynee Eagles  Plasmapheresis for MG flare s/p 5 days Now day 5/5 IVIG Dementia in memory care unit  P:   RASS goal 0 Cont pred at 5mg  d Cont mestinon precedex Serial NIF/VC may not be accurate given dementia    FAMILY  - Updates: Reviewed patient's status with her healthcare power of attorney on 8/14. At this point all aggressive care to hopefully reverse acute process is appropriate. She did note that the patient would not want indefinite invasive support. We will have to revisit goals next 1-2 days, suspect most appropriate approach will be a one way extubation  Update 8/21 spoke to [place]. Now full DNR Plan to extubate today.  Continue current care   - Inter-disciplinary  family meet or Palliative Care meeting due by: 8/16 My ctt 33 min  Erick Colace ACNP-BC [place] Pager # [phone] OR # [phone] if no answer  Attending Note:  I have examined patient, reviewed labs, studies and notes. I have discussed the case with Jerrye Bushy, and I agree with the data and plans as amended above. 83 yo woman with dementia, dysphagia, MG. She is admitted with acute resp failure due to probable aspiration and also MG flare. She has been supported on MV, treated for possible aspiration PNA, received plasmaphoresis and now IVIg. Her improvement in strength has reached a plateau. Discussed case with Dr Rory Percy today, who in turn has discussed with Dr Jaynee Eagles her outpt  neurologist. We have also communicated with the patient's HCPOA Dutch Quint  today. On my eval she remains weak, appears to be confused. She will reach out and hold hands, did not follow commands today. She did not participate with NIF or VC today. All involved, including neurology and her POA agree that it is time for [place] to extubate to see how she does, defer re-intubation should she fail. This based on her overall prognosis and her goals for care. We will proceed with extubation today. Shelda Pal has requested a Palliative Care Consult to support - Dr Rory Percy will request this.  Independent critical care time is 40 minutes.   Baltazar Apo, MD, PhD 11/12/2016, 12:00 PM [facility] [phone] or if no answer [phone]

## 2016-11-12 NOTE — Progress Notes (Signed)
NIF - 12 VC 356

## 2016-11-13 DIAGNOSIS — R0600 Dyspnea, unspecified: Secondary | ICD-10-CM

## 2016-11-13 DIAGNOSIS — K117 Disturbances of salivary secretion: Secondary | ICD-10-CM

## 2016-11-13 DIAGNOSIS — R0609 Other forms of dyspnea: Secondary | ICD-10-CM

## 2016-11-13 MED ORDER — ONDANSETRON HCL 4 MG/2ML IJ SOLN: 4.0000 mg | Freq: Four times a day (QID) | INTRAMUSCULAR | 0 refills | Status: AC | PRN

## 2016-11-13 MED ORDER — MORPHINE SULFATE (PF) 4 MG/ML IV SOLN: 2.0000 mg | INTRAVENOUS | 0 refills | Status: AC | PRN

## 2016-11-13 MED ORDER — GLYCOPYRROLATE 0.2 MG/ML IJ SOLN
0.4000 mg | Freq: Four times a day (QID) | INTRAMUSCULAR | Status: DC
  Administered 2016-11-13 (×2): 0.4 mg via INTRAVENOUS
  Filled 2016-11-13 (×2): qty 2

## 2016-11-13 MED ORDER — LORAZEPAM 2 MG/ML IJ SOLN: 1.0000 mg | INTRAMUSCULAR | 0 refills | Status: AC | PRN

## 2016-11-13 MED ORDER — CYCLOSPORINE 0.05 % OP EMUL: 1.0000 [drp] | Freq: Two times a day (BID) | OPHTHALMIC | Status: AC

## 2016-11-13 MED ORDER — GLYCOPYRROLATE 0.2 MG/ML IJ SOLN: 0.4000 mg | Freq: Four times a day (QID) | INTRAMUSCULAR | Status: AC

## 2016-11-13 NOTE — Progress Notes (Signed)
[facility] hospital liaison: RN visit    Received request from [place], [place] for family interest in [place] with request for transfer today. Chart reviewed. Met with friend, Dutch Quint, who has HCPOA to confirm interest and explain services. Dutch Quint agreeable to transfer today. CSW aware. Registration paper work completed.  Dr. Orpah Melter to assume care per [facility]  request.  Please fax discharge summary to [phone]. RN please call report to [phone]. Please arrange transport for patient to arrive as soon as possible.     Thank you.  Farrel Gordon, RN, [place] [facility] Liaison [phone]   All hospital liaisons are on [place].

## 2016-11-13 NOTE — Progress Notes (Signed)
Report call to [facility] at [facility].

## 2016-11-13 NOTE — Clinical Social Work Note (Signed)
Clinical Social Worker facilitated patient discharge including contacting patient HCPOA and facility to confirm patient discharge plans.  Clinical information faxed to facility and HCPOA agreeable with plan.  CSW arranged ambulance transport via [facility] to [facility].  RN to call report prior to discharge.  Clinical Social Worker will sign off for now as social work intervention is no longer needed. Please consult [place] again if new need arises.  Barbette Or, [place]

## 2016-11-13 NOTE — Discharge Summary (Signed)
Physician Discharge Summary  Patient ID: [name] MRN: [id number] DOB/AGE: [birth year] 81 y.o.  Admit date: 10/29/2016 Discharge date: 11/13/2016    Discharge Diagnoses:  Active Problems:   Acute respiratory failure (HCC)   Encounter for central line placement   Endotracheally intubated   DNR (do not resuscitate)   Palliative care by specialist   Increased oropharyngeal secretions   Dyspnea                                                       D/c plan by Discharge Diagnosis  Myasthenia Gravis - on baseline prednisone 69m QD, followed by Dr. AJaynee Eagles Plasmapheresis for MG flare s/p 5 days Now day 5/5 IVIG Dementia in memory care unit  P:   concentrate on comfort care. PRN ativan   Acute Hypoxic Respiratory Failure, multifactorial due to my sending a flare plus possible aspiration event Aspiration pneumonia P:   Extubated Pulmonary hygiene as we are able. She is clearly not protecting her airway, clearing secretions No plans reintubation, comfort care Robinul PRN  PRN morphine for pain, dyspnea   Demand ischemia, peak Tp 0.06 Cardiogenic pulm edema P:  Stop Lasix 8/22, defer further labs  R At risk for fluid and electrolyte Imbalance  P:   Stop Lasix 8/22, defer further labs   Nausea / Vomiting - suspect related to nasal intubation attempt, resolved P:   Tube feedings discontinued Zofran as needed     Mild Leukocytosis, resolved P:  DVT prophylaxis discontinued  SIRS / Sepsis Aspiration  Pneumonia s/p rx X 7d P:   Completed 7 days antibiotics    Hypothyroidism  Hyperglycemia    P:   Insulin and Synthroid held     Brief Summary:   83y/o F with reported MG on prednisone and advanced dementia (per report, lives in a memory care unit) who was admitted 8/7 with increased SOB.  EMS attempted intubation nasally > pt vomited, concern for aspiration.  Intubated in ER. Now s/p 5 rounds plasma phoresis. 5 days IVIG.  She failed attempt at  extubation on 8/14.  After discussions with family she was extubated 8/21 with plans for transition to comfort care and residential hospice.     STUdies Echo 8/10 nml EF, RVSP 48  CULTURES:  BCx2 8/7 >> ng UC 8/7 >> ng  ANTIBIOTICS: Vanco 8/7 >> 8/10 Zosyn 8/7 >> 8/13  SIGNIFICANT EVENTS: 8/07  Admit with SOB, intubated   LINES/TUBES: ETT 8/7 >> 8/14; 8/14 >> 8/21 Rt IJ HD cath 8/8 >> 8/17 RUE PICC 8/17 >>    Vitals:   11/13/16 0700 11/13/16 0800 11/13/16 0900 11/13/16 1000  BP: (!) 112/54  132/74 122/73  Pulse: 85 91 91 99  Resp: 18 (!) 28 (!) 22 (!) 26  Temp:  98.4 F (36.9 C)    TempSrc:  Axillary    SpO2: 97% 94% 94% 91%  Weight:      Height:         Discharge Labs  BMET  Recent Labs Lab 11/08/16 0403 11/10/16 0412 11/11/16 0411 11/11/16 1942 11/12/16 0616  NA 141 139 141 140 142  K 3.7 3.3* 2.5* 4.4 3.6  CL 114* 106 103 104 103  CO2 23 27 28 27 31   GLUCOSE 122* 135* 112* 134* 103*  BUN 11 15  19 29* 33*  CREATININE 0.44 0.51 0.74 0.74 0.71  CALCIUM 8.3* 8.6* 9.1 9.2 9.4  MG 2.2 1.9 2.1  --   --   PHOS  --  3.7 2.9  --   --      CBC   Recent Labs Lab 11/06/16 1353 11/10/16 0412  HGB 9.9* 8.7*  HCT 29.0* 27.5*  WBC  --  8.5  PLT  --  364   Anti-Coagulation No results for input(s): INR in the last 168 hours.    Discharge Instructions    Continue PICC at discharge    Complete by:  As directed    Flush PICC line per home health policy:  Yes   Discharge patient    Complete by:  As directed    [place] [facility]   Discharge disposition:  [place] Not Defined   Discharge patient date:  11/13/2016          Allergies as of 11/13/2016      Reactions   Ivp Dye [iodinated Diagnostic Agents] Shortness Of Breath   Metrizamide Shortness Of Breath   Red Dye       Medication List    STOP taking these medications   acetaminophen 500 MG tablet Commonly known as:  TYLENOL   alendronate 70 MG  tablet Commonly known as:  FOSAMAX   ALIGN 4 MG Caps   furosemide 20 MG tablet Commonly known as:  LASIX   levothyroxine 75 MCG tablet Commonly known as:  SYNTHROID, LEVOTHROID   LORazepam 0.5 MG tablet Commonly known as:  ATIVAN Replaced by:  LORazepam 2 MG/ML injection   predniSONE 10 MG tablet Commonly known as:  DELTASONE   PRESERVISION AREDS 2 Caps   pyridOXINE 100 MG tablet Commonly known as:  VITAMIN B-6   ranitidine 75 MG tablet Commonly known as:  ZANTAC   Vitamin D (Ergocalciferol) 50000 units Caps capsule Commonly known as:  DRISDOL     TAKE these medications   cycloSPORINE 0.05 % ophthalmic emulsion Commonly known as:  RESTASIS Place 1 drop into both eyes 2 (two) times daily.   glycopyrrolate 0.2 MG/ML injection Commonly known as:  ROBINUL Inject 2 mLs (0.4 mg total) into the vein 4 (four) times daily.   LORazepam 2 MG/ML injection Commonly known as:  ATIVAN Inject 0.5 mLs (1 mg total) into the vein every 4 (four) hours as needed for anxiety. Replaces:  LORazepam 0.5 MG tablet   morphine 4 MG/ML injection Inject 0.5 mLs (2 mg total) into the vein every 2 (two) hours as needed for moderate pain (dyspnea).   ondansetron 4 MG/2ML Soln injection Commonly known as:  ZOFRAN Inject 2 mLs (4 mg total) into the vein every 6 (six) hours as needed for nausea or vomiting.            Discharge Care Instructions        Start     Ordered   11/13/16 0000  cycloSPORINE (RESTASIS) 0.05 % ophthalmic emulsion  2 times daily     11/13/16 1138   11/13/16 0000  LORazepam (ATIVAN) 2 MG/ML injection  Every 4 hours PRN     11/13/16 1138   11/13/16 0000  morphine 4 MG/ML injection  Every 2 hours PRN     11/13/16 1138   11/13/16 0000  ondansetron (ZOFRAN) 4 MG/2ML SOLN injection  Every 6 hours PRN     11/13/16 1138   11/13/16 0000  glycopyrrolate (ROBINUL) 0.2 MG/ML injection  4 times  daily     11/13/16 1138   11/13/16 0000  Continue PICC at discharge     Question:  Flush PICC line per home health policy  Answer:  Yes   11/13/16 1138   11/13/16 0000  Discharge patient    Comments:  [name]  Question Answer Comment  Discharge disposition [place] Not Defined   Discharge patient date 11/13/2016      11/13/16 1138        Disposition: residential hospice   Discharged Condition: [name] has met maximum benefit of inpatient care and is medically stable and cleared for discharge.  Patient is pending follow up as above.      Time spent on disposition:  Greater than 35 minutes.   SignedNickolas Madrid, NP 11/13/2016  11:38 AM Pager: [phone] or [phone]

## 2016-11-13 NOTE — Progress Notes (Addendum)
Neurology progress note.  Subjective: Patient was extubated yesterday. She continues to have some difficulty breathing. Palliative care on board. Healthcare proxy made decision to not reintubated should she go into respiratory failure. Plan for residential hospice today.  Objective: Vitals:   11/13/16 0500 11/13/16 0600  BP: (!) 103/52 102/67  Pulse: 89 83  Resp: 15 16  Temp:    SpO2: 98% 98%   Gen.: Not sedated not intubated. In no acute distress. Respiratory: Mild increased work of breathing noted. Decreased breath sounds all over. CVS: Regular rate rhythm Extremities: Warm well-perfused Abdomen: Nondistended nontender next on neurological exam Mental status: Alert, awake, follows all commands. Is aphonic at this time. Able to follow all commands. Cranial nerves: Pupils equal round reactive to light, ptosis on sustained upward gaze, diplopia on end gazes in both directions, face symmetric. Motor exam: 4/5 strength in all 4 extremities. Some fatigability. Decreased bulk in all extent. Sensory exam: Intact to light touch all over. DTRs 2+ symmetrical all over.  CBC    Component Value Date/Time   WBC 8.5 11/10/2016 0412   RBC 2.85 (L) 11/10/2016 0412   HGB 8.7 (L) 11/10/2016 0412   HGB 14.6 12/06/2015 1640   HCT 27.5 (L) 11/10/2016 0412   HCT 43.7 12/06/2015 1640   PLT 364 11/10/2016 0412   PLT 193 12/06/2015 1640   MCV 96.5 11/10/2016 0412   MCV 92 12/06/2015 1640   MCH 30.5 11/10/2016 0412   MCHC 31.6 11/10/2016 0412   RDW 15.6 (H) 11/10/2016 0412   RDW 16.2 (H) 12/06/2015 1640   LYMPHSABS 5.0 (H) 10/29/2016 1035   MONOABS 0.8 10/29/2016 1035   EOSABS 0.1 10/29/2016 1035   BASOSABS 0.0 10/29/2016 1035   CMP     Component Value Date/Time   NA 142 11/12/2016 0616   NA 140 12/06/2015 1640   K 3.6 11/12/2016 0616   CL 103 11/12/2016 0616   CO2 31 11/12/2016 0616   GLUCOSE 103 (H) 11/12/2016 0616   BUN 33 (H) 11/12/2016 0616   BUN 23 12/06/2015 1640   CREATININE 0.71 11/12/2016 0616   CALCIUM 9.4 11/12/2016 0616   PROT 5.8 (L) 12/06/2015 1640   ALBUMIN 3.9 12/06/2015 1640   AST 24 12/06/2015 1640   ALT 81 (H) 12/06/2015 1640   ALKPHOS 72 12/06/2015 1640   BILITOT 0.7 12/06/2015 1640   GFRNONAA >60 11/12/2016 0616   GFRAA >60 11/12/2016 0616    Current Facility-Administered Medications:  .  0.9 %  sodium chloride infusion, , Intravenous, Continuous, Mannam, Praveen, MD, Last Rate: 10 mL/hr at 11/12/16 0800 .  chlorhexidine gluconate (MEDLINE KIT) (PERIDEX) 0.12 % solution 15 mL, 15 mL, Mouth Rinse, BID, Mannam, Praveen, MD, 15 mL at 11/13/16 0846 .  Chlorhexidine Gluconate Cloth 2 % PADS 6 each, 6 each, Topical, Daily, Mannam, Praveen, MD, 6 each at 11/12/16 1030 .  cycloSPORINE (RESTASIS) 0.05 % ophthalmic emulsion 1 drop, 1 drop, Both Eyes, BID, Salvadore Dom E, NP, 1 drop at 11/13/16 1001 .  furosemide (LASIX) injection 40 mg, 40 mg, Intravenous, Q6H, Raylene Miyamoto, MD, 40 mg at 11/13/16 0836 .  glycopyrrolate (ROBINUL) injection 0.4 mg, 0.4 mg, Intravenous, QID, Knox Royalty, NP, 0.4 mg at 11/13/16 0955 .  LORazepam (ATIVAN) injection 1 mg, 1 mg, Intravenous, Q4H PRN, Knox Royalty, NP, 1 mg at 11/13/16 0058 .  morphine 4 MG/ML injection 2 mg, 2 mg, Intravenous, Q2H PRN, Knox Royalty, NP, 2 mg at 11/13/16 1016 .  ondansetron (ZOFRAN) injection 4 mg, 4 mg, Intravenous, Q6H PRN, Collene Gobble, MD, 4 mg at 11/10/16 1300 .  sodium chloride flush (NS) 0.9 % injection 10-40 mL, 10-40 mL, Intracatheter, Q12H, Mannam, Praveen, MD, 10 mL at 11/13/16 1001 .  sodium chloride flush (NS) 0.9 % injection 10-40 mL, 10-40 mL, Intracatheter, PRN, Marshell Garfinkel, MD   Assessment 83 year old woman with a past medical history of myasthenia gravis, who presented with respiratory failure. She was intubated and then extubated followed by reintubation. She is now finally been extubated and currently is saturating normally on room air with  some increased work of breathing. Next line palliative care has been involved and decision by the healthcare proxy is been made for residential hospice.  Impression Myasthenia crisis Acute respiratory failure now resolved.  Recommendations Completed 5 days of IVIG and 5 rounds of plasma exchange. From a myasthenia standpoint, it would be okay from our site to put her back on prednisone 60 mg per day.   Further recommendations per palliative care medicine.  We will be available, should our services be required. Please feel free to call us as needed.   Amie Portland, MD [facility] [phone]  If 7pm to 7am, please call on call as listed on AMION.

## 2016-11-13 NOTE — Progress Notes (Signed)
PULMONARY / CRITICAL CARE MEDICINE   Name: [name] MRN: [id number] DOB: July 12, 1930    ADMISSION DATE:  10/29/2016 CONSULTATION DATE:  10/29/16  REFERRING MD:  Dr. Laverta [place] / EDP   CHIEF COMPLAINT:  SOB  HISTORY OF PRESENT ILLNESS:   83 y/o F who presented to [facility] on 8/7 via EMS from a SNF with reports of increasing shortness of breath and confusion.  Hx Myasthenia Gravis (baseline prednisone 5mg , followed by Dr. Jaynee Eagles) and dementia > lives in a memory care unit.   She was combative and hypoxic with EMS who apparently attempted nasal intubation which was unsuccessful. Saturations were in the 70's on NRB.  She also vomited in route with concern for possible aspiration.  BVM ventilation provided until arrival to ER. On arrival to ER, she unresponsive and was intubated for airway protection.   STUdies Echo 8/10 nml EF, RVSP 48  CULTURES:  BCx2 8/7 >> ng UC 8/7 >> ng  ANTIBIOTICS: Vanco 8/7 >> 8/10 Zosyn 8/7 >> 8/13  SIGNIFICANT EVENTS: 8/07  Admit with SOB, intubated   LINES/TUBES: ETT 8/7 >> 8/14; 8/14 >> 8/21 Rt IJ HD cath 8/8 >> 8/17 RUE PICC 8/17 >>   SUBJECTIVE: Extubated 8/21, difficulty managing secretions soon after As per discussions with neurology, palliative care, patient's HCPOA we have transitioned her to comfort care.  VITAL SIGNS: BP 122/73   Pulse 99   Temp 98.4 F (36.9 C) (Axillary)   Resp (!) 26   Ht 5\' 1"  (1.549 m)   Wt 58.7 kg (129 lb 4.8 oz)   SpO2 91%   BMI 24.43 kg/m   HEMODYNAMICS:    VENTILATOR SETTINGS: Plateau Pressure:  [26 cmH20] 26 cmH20  INTAKE / OUTPUT: I/O last 3 completed shifts: In: 1958 [I.V.:908; NG/GT:1050] Out: 4510 [Urine:4510]  PHYSICAL EXAMINATION:. General appearance:  No distress Eyes: Pupils equal, has had some diplopia Mouth:  Some oral secretions Neck: Upper airway noise and secretions present Lungs/chest: Bilateral scattered rhonchi CV: Regular, no murmur Abdomen: Soft,  benign Extremities: No significant edema Skin: No rash Neuro: Globally weak, very poor cough, unable to clear secretions, unable to interact with me and answer questions   LABS:  BMET  Recent Labs Lab 11/11/16 0411 11/11/16 1942 11/12/16 0616  NA 141 140 142  K 2.5* 4.4 3.6  CL 103 104 103  CO2 28 27 31   BUN 19 29* 33*  CREATININE 0.74 0.74 0.71  GLUCOSE 112* 134* 103*    Electrolytes  Recent Labs Lab 11/08/16 0403 11/10/16 0412 11/11/16 0411 11/11/16 1942 11/12/16 0616  CALCIUM 8.3* 8.6* 9.1 9.2 9.4  MG 2.2 1.9 2.1  --   --   PHOS  --  3.7 2.9  --   --     CBC  Recent Labs Lab 11/06/16 1353 11/10/16 0412  WBC  --  8.5  HGB 9.9* 8.7*  HCT 29.0* 27.5*  PLT  --  364    Coag's No results for input(s): APTT, INR in the last 168 hours.  Sepsis Markers No results for input(s): LATICACIDVEN, PROCALCITON, O2SATVEN in the last 168 hours.  ABG No results for input(s): PHART, PCO2ART, PO2ART in the last 168 hours.  Liver Enzymes No results for input(s): AST, ALT, ALKPHOS, BILITOT, ALBUMIN in the last 168 hours.  Cardiac Enzymes No results for input(s): TROPONINI, PROBNP in the last 168 hours.  Glucose  Recent Labs Lab 11/11/16 1613 11/11/16 1956 11/11/16 2355 11/12/16 0342 11/12/16 2025 11/12/16 1115  GLUCAP 147* 129* 125* 142* 164* 123*   Imaging No results found.reviewed  STUDIES:    DISCUSSION: 83 y/o F with reported MG on prednisone and advanced dementia (per report, lives in a memory care unit) who was admitted 8/7 with increased SOB.  EMS attempted intubation nasally > pt vomited, concern for aspiration.  Intubated in ER. Now s/p 5 rounds plasma phoresis. 5 days IVIG. Not sure if her NIF is accurate w/ dementia. Will extubate. Has failed extubation twice. We are now transitioning to comfort care  ASSESSMENT / PLAN:  PULMONARY A: Acute Hypoxic Respiratory Failure, multifactorial due to my sending a flare plus possible aspiration  event Aspiration pneumonia P:   Extubated Pulmonary hygiene as we are able. She is clearly not protecting her airway, clearing secretions No plans reintubation, comfort care  CARDIOVASCULAR A:  Demand ischemia, peak Tp 0.06 Cardiogenic pulm edema P:  Stop Lasix 8/22, defer further labs  RENAL A: At risk for fluid and electrolyte Imbalance  P:   Stop Lasix 8/22, defer further labs  GASTROINTESTINAL A:   Nausea / Vomiting - suspect related to nasal intubation attempt, resolved P:   Tube feedings discontinued Zofran as needed  HEMATOLOGIC A:   Mild Leukocytosis, resolved P:  DVT prophylaxis discontinued  INFECTIOUS A:   SIRS / Sepsis Aspiration  Pneumonia s/p rx X 7d P:   Completed 7 days antibiotics  ENDOCRINE A:   Hypothyroidism  Hyperglycemia    P:   Insulin and Synthroid held  NEUROLOGIC A:   Myasthenia Gravis - on baseline prednisone 5mg  QD, followed by Dr. Jaynee Eagles  Plasmapheresis for MG flare s/p 5 days Now day 5/5 IVIG Dementia in memory care unit  P:   RASS goal 0 Neurology has mentioned an increase in prednisone. Not clear that this will change outcome. We will defer, concentrate on comfort care.   FAMILY  - Updates: Reviewed patient's status with her healthcare power of attorney on 8/14. At this point all aggressive care to hopefully reverse acute process is appropriate. She did note that the patient would not want indefinite invasive support. We will have to revisit goals next 1-2 days, suspect most appropriate approach will be a one way extubation   Baltazar Apo, MD, PhD 11/13/2016, 10:59 AM [facility] [phone] or if no answer [phone]

## 2016-11-13 NOTE — Progress Notes (Signed)
Daily Progress Note   Patient Name: [name]       Date: 11/13/2016 DOB: July 04, 1930  Age: 83 y.o. MRN#: [id number] Attending Physician: Marshell Garfinkel, MD Primary Care Physician: Lajean Manes, MD Admit Date: 10/29/2016  Reason for Consultation/Follow-up: Disposition, Non pain symptom management, Pain control and Psychosocial/spiritual support  Subjective:  Continued conversation this morning with healthcare power of attorney named Barbarann Ehlers discussing diagnosis, prognosis, [place], EOL wishes disposition and options.  She feels confident in her decision that focus of care is comfort at this time, knowing prognosis is likely days.  Natural trajectory and expectations at EOL were discussed.    Questions and concerns addressed.  Encouraged to call with questions or concerns.  PMT will continue to support holistically.   Length of Stay: 15  Current Medications: Scheduled Meds:  . chlorhexidine gluconate (MEDLINE KIT)  15 mL Mouth Rinse BID  . Chlorhexidine Gluconate Cloth  6 each Topical Daily  . cycloSPORINE  1 drop Both Eyes BID  . furosemide  40 mg Intravenous Q6H  . glycopyrrolate  0.4 mg Intravenous TID  . sodium chloride flush  10-40 mL Intracatheter Q12H    Continuous Infusions: . sodium chloride 10 mL/hr at 11/12/16 0800    PRN Meds: LORazepam, morphine injection, ondansetron (ZOFRAN) IV, sodium chloride flush  Physical Exam  Constitutional: She appears listless. She appears ill. Nasal cannula in place.  Cardiovascular: Tachycardia present.   Pulmonary/Chest: She has decreased breath sounds. She has rhonchi.  Neurological: She appears listless.  Skin: Skin is warm and dry.            Vital Signs: BP 102/67   Pulse 83   Temp 98.3 F (36.8 C) (Axillary)    Resp 16   Ht 5' 1"  (1.549 m)   Wt 58.7 kg (129 lb 4.8 oz)   SpO2 98%   BMI 24.43 kg/m  SpO2: SpO2: 98 % O2 Device: O2 Device: Nasal Cannula O2 Flow Rate: O2 Flow Rate (L/min): 5 L/min  Intake/output summary:  Intake/Output Summary (Last 24 hours) at 11/13/16 0823 Last data filed at 11/13/16 0600  Gross per 24 hour  Intake             1238 ml  Output             1660 ml  Net             -422 ml   LBM: Last BM Date: 11/09/16 Baseline Weight: Weight: 61.2 kg (135 lb) Most recent weight: Weight: 58.7 kg (129 lb 4.8 oz)       Palliative Assessment/Data: 20%    Flowsheet Rows     Most Recent Value  Intake Tab  Referral Department  Neurology  Unit at Time of Referral  ICU  Palliative Care Primary Diagnosis  Neurology  Date Notified  11/12/16  Palliative Care Type  New Palliative care  Reason for referral  Clarify Goals of Care  Date of Admission  10/29/16  Date first seen by Palliative Care  11/12/16  # of days Palliative referral response time  0 Day(s)  # of days IP prior to Palliative referral  14  Clinical Assessment  Palliative Performance Scale Score  20%  Psychosocial & Spiritual Assessment  Palliative Care Outcomes      Patient Active Problem List   Diagnosis Date Noted  . DNR (do not resuscitate) 11/12/2016  . Palliative care by specialist 11/12/2016  . Endotracheally intubated   . Encounter for central line placement   . Acute respiratory failure ([place]) 10/29/2016  . Myasthenia gravis ([place]) 12/12/2015  . Myasthenic crisis ([place]) 12/12/2015  . Cholelithiasis with cholecystitis 04/15/2012    Palliative Care Assessment & Plan     Assessment: -  83 y.o. female   admitted on 10/29/2016 with a known history of myasthenia gravis, plasmapheresis on August 9 for myasthenia flare.  She lives at [facility]/memory care.  Admitted Via EMS from her facility with reports of increased shortness of breath and confusion  -In spite of 10 day aggressive medical  interventions has made no real meaningful recovery.   -focus  of care is comfort quality and dignity  Recommendations/Plan:   Healthcare power of attorney is hopeful for residential hospice placement today  Symptom management   Dyspnea- Morphine IV 2 mg every 2 hours when necessary  Terminal Secretions- Robinol IV 4 mg  4 times a day/ NT suctioning  Agitation-Ativan 1 mg IV every 4 hours when necessary   Goals of Care and Additional Recommendations:   Limitations on Scope of Treatment: Full Comfort Care  Code Status:    Code Status Orders        Start     Ordered   11/12/16 1322  Do not attempt resuscitation (DNR)  Continuous     11/12/16 1321    Code Status History    Date Active Date Inactive Code Status Order ID Comments User Context   10/29/2016 12:23 PM 11/12/2016  1:21 PM Full Code [id number]  Erick Colace, NP ED   04/30/2012 12:43 PM 05/01/2012  7:14 PM Full Code [id number]  Derek Jack, RN Inpatient    Advance Directive Documentation     Most Recent Value  Type of Advance Directive  Healthcare Power of Attorney  Pre-existing out of facility DNR order (yellow form or pink MOST form)  -  "MOST" Form in Place?  -       Prognosis:   < 2 weeks- patient has high symptom burden  Discharge Planning:  [place] was discussed with Nursing and Marni Griffon NP  Thank you for allowing the Palliative Medicine Team to assist in the care of this patient.   Time In: 0745 Time Out: 0815 Total Time 45 min Prolonged Time Billed  no  Greater than 50%  of this time was spent counseling and coordinating care related to the above assessment and plan.  Wadie Lessen, NP  Please contact Palliative Medicine Team phone at [phone] for questions and concerns.

## 2016-11-23 DEATH — deceased

## 2017-02-11 ENCOUNTER — Ambulatory Visit: Payer: Medicare Other | Admitting: Neurology

## 2017-05-21 NOTE — Telephone Encounter (Signed)
Error

## 2018-05-13 IMAGING — CR DG LUMBAR SPINE COMPLETE 4+V
4 series · 4 of 4 positions shown · non-contrast
Comparison: None.

CLINICAL DATA: Low back pain after fall 2 days ago.

EXAM:
LUMBAR SPINE - COMPLETE 4+ VIEW

[t l-spine a.p.]
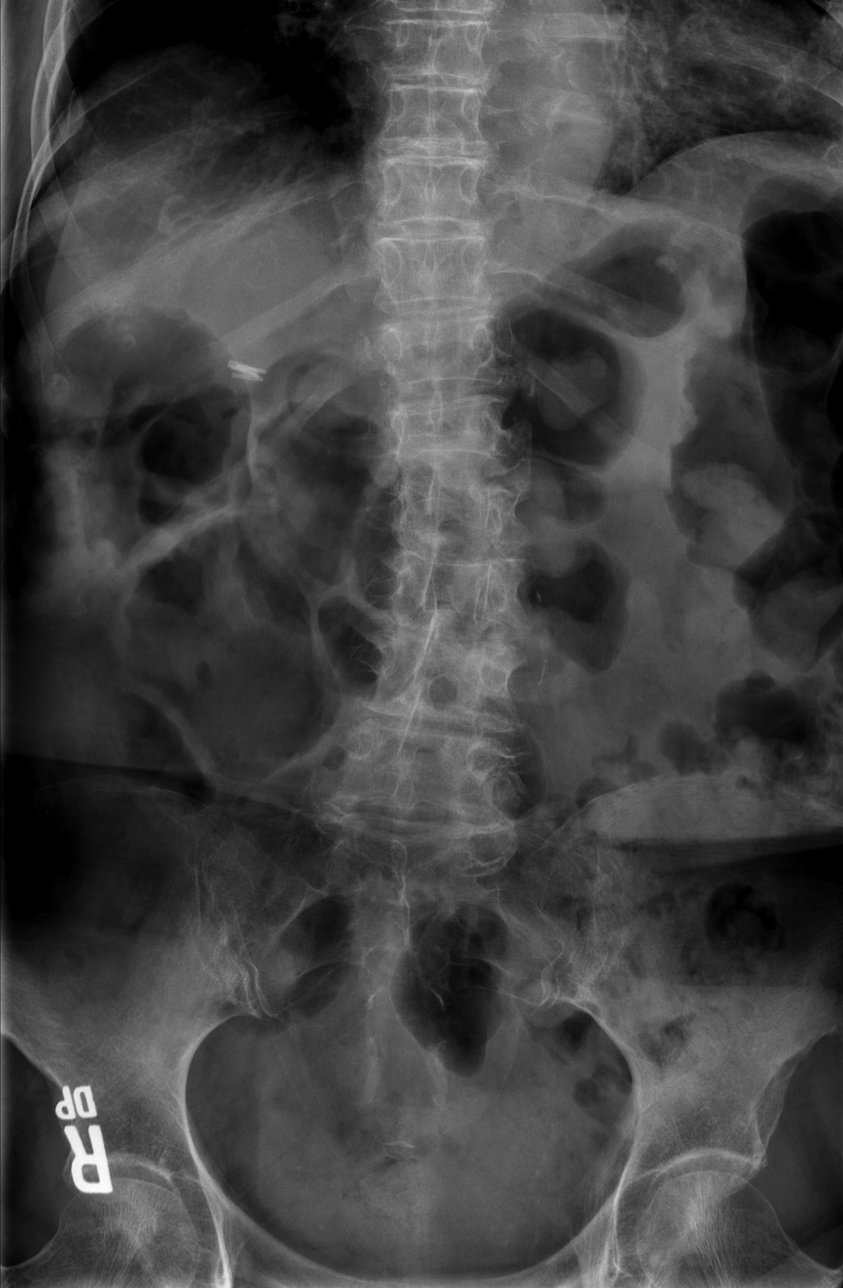

[t l-spine oblique exposure (1 of 2)]
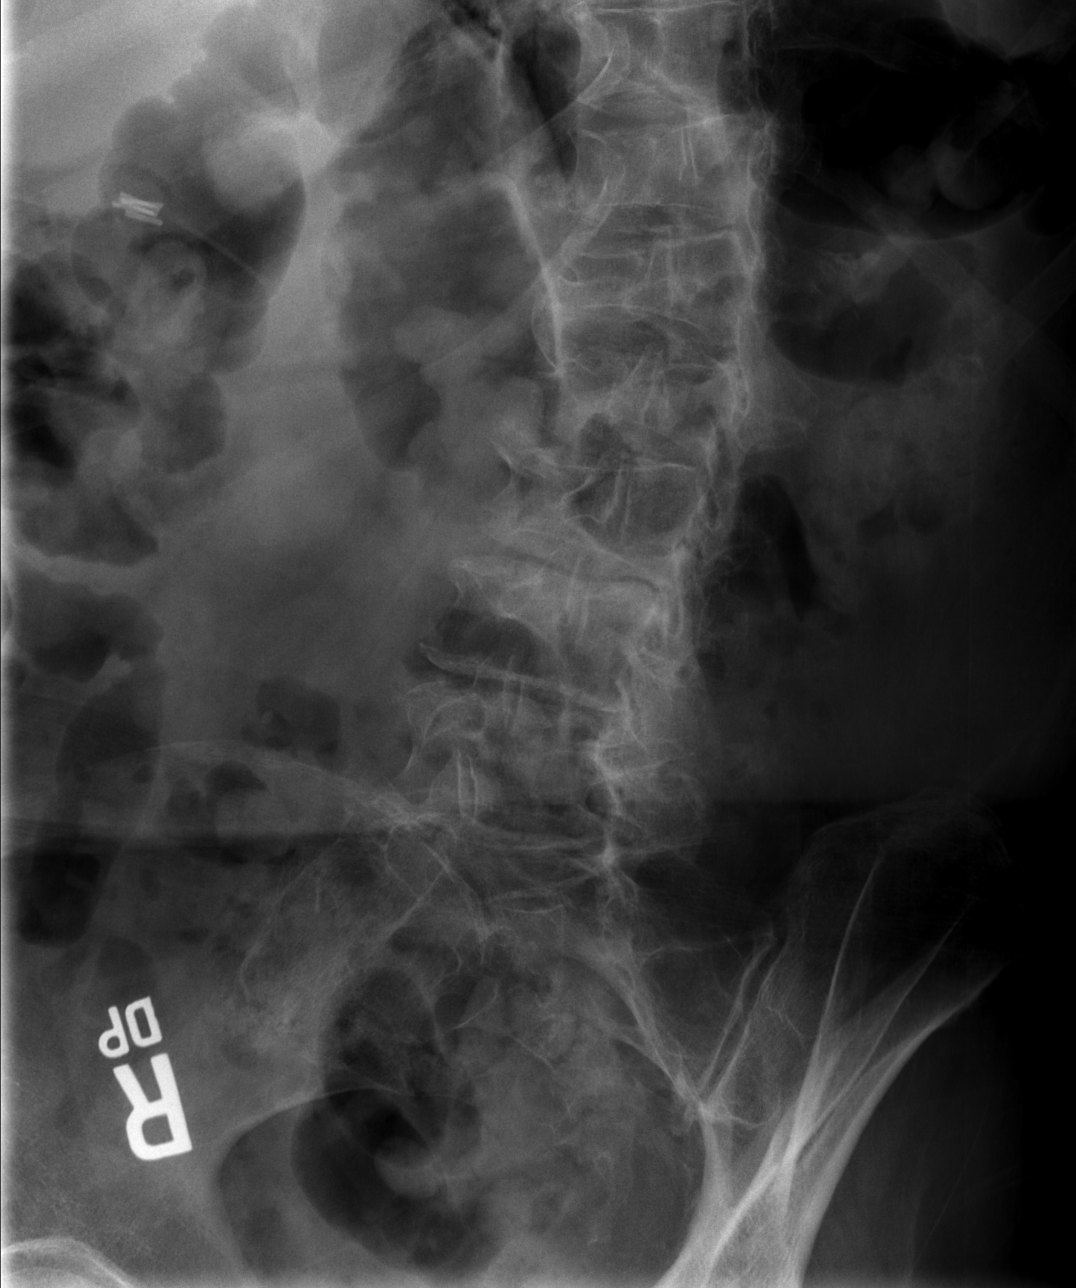

[t l-spine oblique exposure (2 of 2)]
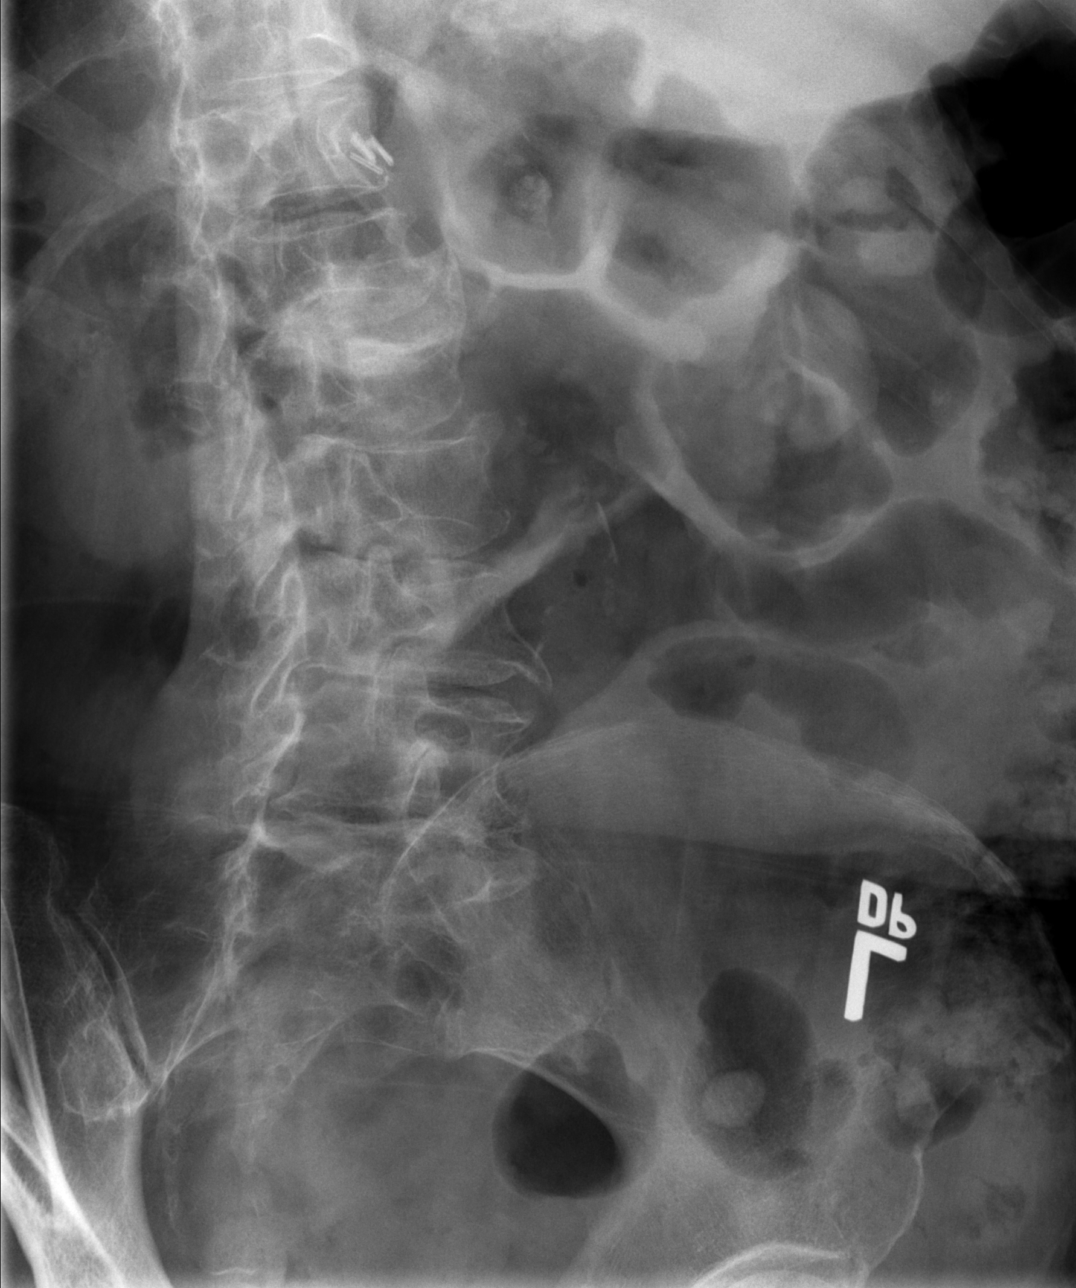

[t l-spine lat]
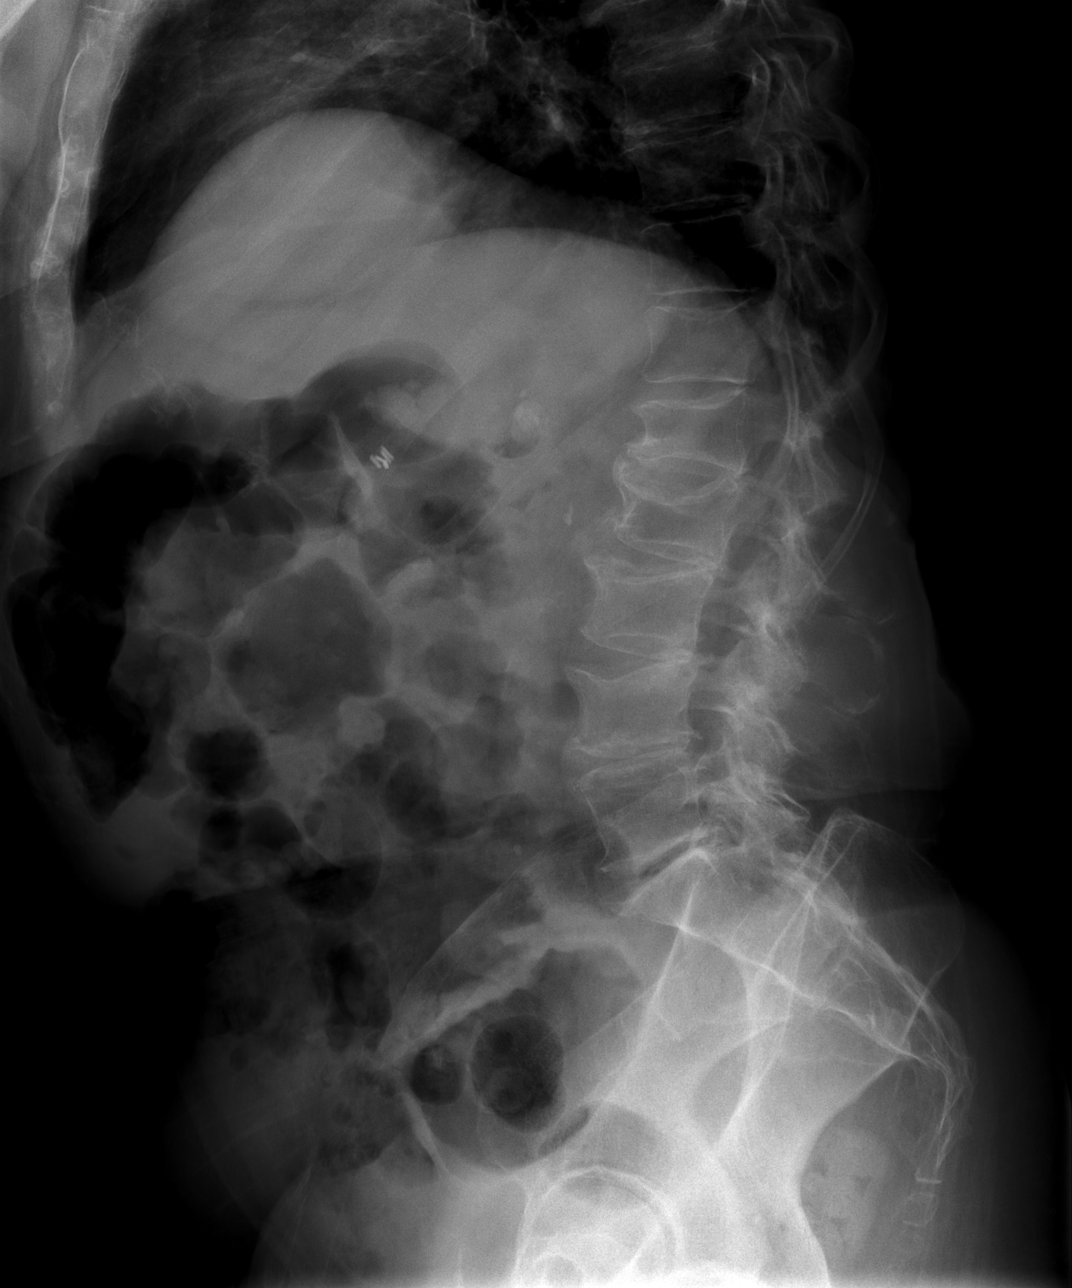

[4 of 4 positions shown; findings below may reference images not displayed]

FINDINGS: Mild levoscoliosis of lumbar spine is noted. Moderate superior
endplate depression of L2 vertebral body is noted most consistent
with old fracture, but acute fracture cannot be excluded. Mild wedge
compression deformity of L1 vertebral body is noted which most
likely is chronic. Mild superior endplate depression of L3 and L4
vertebral bodies is noted as well, most consistent with old
fractures. Moderate degenerative disc disease is noted at L4-5 and
L5-S1. No spondylolisthesis is noted. Atherosclerosis of abdominal
aorta is noted.
IMPRESSION: Aortic atherosclerosis. Multilevel degenerative disc disease.
Probable multiple old fractures are noted in the lumbar spine.
Moderate superior endplate depression of L2 vertebral body is noted
which is most consistent with old fracture, but acute fracture
cannot be excluded. If there is clinical concern for acute fracture,
MRI may be performed for further evaluation.
